# Patient Record
Sex: Female | Born: 1950 | Race: White | Hispanic: No | Marital: Married | Smoking: Never smoker
Health system: Southern US, Community
[De-identification: ages and names within clinical notes are randomized; demographics above are authoritative.]

## PROBLEM LIST (undated history)

## (undated) DIAGNOSIS — Z8249 Family history of ischemic heart disease and other diseases of the circulatory system: Secondary | ICD-10-CM

## (undated) DIAGNOSIS — D649 Anemia, unspecified: Secondary | ICD-10-CM

## (undated) DIAGNOSIS — M1990 Unspecified osteoarthritis, unspecified site: Secondary | ICD-10-CM

## (undated) DIAGNOSIS — I2510 Atherosclerotic heart disease of native coronary artery without angina pectoris: Secondary | ICD-10-CM

## (undated) DIAGNOSIS — E119 Type 2 diabetes mellitus without complications: Secondary | ICD-10-CM

## (undated) DIAGNOSIS — N189 Chronic kidney disease, unspecified: Secondary | ICD-10-CM

## (undated) DIAGNOSIS — K219 Gastro-esophageal reflux disease without esophagitis: Secondary | ICD-10-CM

## (undated) DIAGNOSIS — M2550 Pain in unspecified joint: Secondary | ICD-10-CM

## (undated) DIAGNOSIS — Z9889 Other specified postprocedural states: Secondary | ICD-10-CM

## (undated) DIAGNOSIS — K635 Polyp of colon: Secondary | ICD-10-CM

## (undated) DIAGNOSIS — E669 Obesity, unspecified: Secondary | ICD-10-CM

## (undated) DIAGNOSIS — I219 Acute myocardial infarction, unspecified: Secondary | ICD-10-CM

## (undated) DIAGNOSIS — E785 Hyperlipidemia, unspecified: Secondary | ICD-10-CM

## (undated) DIAGNOSIS — Z9851 Tubal ligation status: Secondary | ICD-10-CM

## (undated) DIAGNOSIS — G4730 Sleep apnea, unspecified: Secondary | ICD-10-CM

## (undated) DIAGNOSIS — I5033 Acute on chronic diastolic (congestive) heart failure: Secondary | ICD-10-CM

## (undated) DIAGNOSIS — I10 Essential (primary) hypertension: Secondary | ICD-10-CM

## (undated) DIAGNOSIS — C439 Malignant melanoma of skin, unspecified: Secondary | ICD-10-CM

## (undated) HISTORY — DX: Polyp of colon: K63.5

## (undated) HISTORY — DX: Anemia, unspecified: D64.9

## (undated) HISTORY — DX: Malignant melanoma of skin, unspecified: C43.9

## (undated) HISTORY — DX: Obesity, unspecified: E66.9

## (undated) HISTORY — DX: Unspecified osteoarthritis, unspecified site: M19.90

## (undated) HISTORY — DX: Tubal ligation status: Z98.51

## (undated) HISTORY — DX: Chronic kidney disease, unspecified: N18.9

## (undated) HISTORY — PX: EYE SURGERY: SHX253

## (undated) HISTORY — DX: Acute myocardial infarction, unspecified: I21.9

## (undated) HISTORY — DX: Hyperlipidemia, unspecified: E78.5

## (undated) HISTORY — DX: Pain in unspecified joint: M25.50

## (undated) HISTORY — DX: Sleep apnea, unspecified: G47.30

## (undated) HISTORY — DX: Atherosclerotic heart disease of native coronary artery without angina pectoris: I25.10

## (undated) HISTORY — PX: CARDIAC CATHETERIZATION: SHX172

## (undated) HISTORY — DX: Other specified postprocedural states: Z98.890

---

## 1972-08-17 HISTORY — PX: TUBAL LIGATION: SHX77

## 1976-08-17 DIAGNOSIS — Z9851 Tubal ligation status: Secondary | ICD-10-CM

## 1976-08-17 HISTORY — DX: Tubal ligation status: Z98.51

## 1998-08-17 DIAGNOSIS — H269 Unspecified cataract: Secondary | ICD-10-CM

## 1998-08-17 HISTORY — DX: Unspecified cataract: H26.9

## 1998-08-23 ENCOUNTER — Encounter: Payer: Self-pay | Admitting: Cardiovascular Disease

## 1998-08-23 ENCOUNTER — Ambulatory Visit (HOSPITAL_COMMUNITY): Admission: RE | Admit: 1998-08-23 | Discharge: 1998-08-23 | Payer: Self-pay | Admitting: Cardiovascular Disease

## 1999-08-20 ENCOUNTER — Ambulatory Visit (HOSPITAL_COMMUNITY): Admission: RE | Admit: 1999-08-20 | Discharge: 1999-08-20 | Payer: Self-pay

## 2002-05-05 ENCOUNTER — Encounter: Payer: Self-pay | Admitting: Pulmonary Disease

## 2002-05-05 ENCOUNTER — Ambulatory Visit (HOSPITAL_COMMUNITY): Admission: RE | Admit: 2002-05-05 | Discharge: 2002-05-05 | Payer: Self-pay | Admitting: Pulmonary Disease

## 2011-04-28 ENCOUNTER — Encounter (INDEPENDENT_AMBULATORY_CARE_PROVIDER_SITE_OTHER): Payer: Self-pay

## 2011-04-29 ENCOUNTER — Ambulatory Visit (INDEPENDENT_AMBULATORY_CARE_PROVIDER_SITE_OTHER): Payer: BC Managed Care – PPO | Admitting: General Surgery

## 2011-04-29 ENCOUNTER — Encounter (INDEPENDENT_AMBULATORY_CARE_PROVIDER_SITE_OTHER): Payer: Self-pay | Admitting: General Surgery

## 2011-04-29 ENCOUNTER — Other Ambulatory Visit (INDEPENDENT_AMBULATORY_CARE_PROVIDER_SITE_OTHER): Payer: Self-pay | Admitting: General Surgery

## 2011-04-29 VITALS — BP 126/64 | HR 72

## 2011-04-29 DIAGNOSIS — R1013 Epigastric pain: Secondary | ICD-10-CM

## 2011-04-29 DIAGNOSIS — R1084 Generalized abdominal pain: Secondary | ICD-10-CM

## 2011-04-29 NOTE — Progress Notes (Signed)
Chief Complaint  Patient presents with  . Other    eval of gallbladder     HPI Courtney Pittman is a 60 y.o. female.   HPI  She returns complaining of a sharp left subscapular pain postprandially. This is more troublesome than when I saw her in May of 2011. Has associated with nausea and vomiting.  No right upper quadrant pain. His epigastric abdominal pain after the nausea and vomiting. Sometimes she gets relief after taking TUMS. No diarrhea or constipation. No fevers or chills.  She has no left shoulder pain with activity.  Past Medical History  Diagnosis Date  . Diabetes mellitus   . Hypertension   . Hyperlipidemia   . Joint pain   . Arthritis   . Vomiting     Past Surgical History  Procedure Date  . Cesarean section 1974  . Tubal ligation 1974  . Eye surgery     cataracts both eyes  . Cardiac catheterization     Family History  Problem Relation Age of Onset  . Hypertension Sister   . Hypertension Brother     Social History History  Substance Use Topics  . Smoking status: Never Smoker   . Smokeless tobacco: Not on file  . Alcohol Use: No    Allergies  Allergen Reactions  . Penicillins     Current Outpatient Prescriptions  Medication Sig Dispense Refill  . aspirin 81 MG tablet Take 81 mg by mouth daily.        Marland Kitchen doxazosin (CARDURA) 4 MG tablet Take 4 mg by mouth at bedtime.        . insulin glargine (LANTUS) 100 UNIT/ML injection Inject into the skin. Injects 19 units in a.m., and 25 units in p.m.      Marland Kitchen insulin lispro (HUMALOG) 100 UNIT/ML injection Inject into the skin. Sliding scale       . metoprolol tartrate (LOPRESSOR) 25 MG tablet Take 25 mg by mouth 2 (two) times daily.        Marland Kitchen omega-3 acid ethyl esters (LOVAZA) 1 G capsule Take 2 g by mouth 2 (two) times daily. Takes one in a.m., one in p.m.       . ONE TOUCH ULTRA TEST test strip       . pramlintide (SYMLIN) 600 MCG/ML injection Inject 60 mcg into the skin 3 (three) times daily before meals.          . ramipril (ALTACE) 10 MG capsule Take 10 mg by mouth.        . simvastatin (ZOCOR) 40 MG tablet Take 40 mg by mouth every morning.        . torsemide (DEMADEX) 20 MG tablet Take 20 mg by mouth every morning.        . Venlafaxine HCl 37.5 MG TB24 Take by mouth.          Review of Systems Review of Systems  Constitutional: Negative for fever, activity change and appetite change.  Respiratory: Positive for shortness of breath.   Cardiovascular: Negative for chest pain.  Gastrointestinal: Positive for abdominal pain. Negative for anal bleeding.       Has not had a colonoscopy.    Blood pressure 126/64, pulse 72.  Physical Exam Physical Exam  Constitutional: No distress.       Overweight.  Eyes: Conjunctivae are normal. Pupils are equal, round, and reactive to light.  Abdominal: Soft. Bowel sounds are normal. She exhibits no distension and no mass. There is no tenderness.  There is no guarding.    Data Reviewed Previous note  Assessment    Worsening left scapular pain postprandially. Associated with nausea and vomiting. No right upper quadrant pain. Abdominal pain occurs after the nausea and vomiting. I still do not believe this is from her gallbladder. This is more likely a gastric etiology in my opinion. Could be related to either a hiatal hernia or gastroparesis.  Of note, she has not had a colonoscopy.    Plan    Will order an upper GI and gastric emptying scan. I think it is very likely she will need a gastroenterology consultation. I will discuss results of these tests when I get them and make the referral as needed.       Johnnetta Holstine J 04/29/2011, 9:45 AM

## 2011-04-29 NOTE — Patient Instructions (Signed)
We will call you with your test dates and then the results when we receive them.

## 2011-05-04 ENCOUNTER — Ambulatory Visit (HOSPITAL_COMMUNITY)
Admission: RE | Admit: 2011-05-04 | Discharge: 2011-05-04 | Disposition: A | Discharge status: Home / Self Care | Payer: BC Managed Care – PPO | Source: Ambulatory Visit | Attending: General Surgery | Admitting: General Surgery

## 2011-05-04 DIAGNOSIS — R1084 Generalized abdominal pain: Secondary | ICD-10-CM

## 2011-05-04 DIAGNOSIS — R188 Other ascites: Secondary | ICD-10-CM | POA: Insufficient documentation

## 2011-05-04 DIAGNOSIS — R109 Unspecified abdominal pain: Secondary | ICD-10-CM | POA: Insufficient documentation

## 2011-05-04 DIAGNOSIS — R112 Nausea with vomiting, unspecified: Secondary | ICD-10-CM | POA: Insufficient documentation

## 2011-05-08 ENCOUNTER — Encounter (HOSPITAL_COMMUNITY)
Admission: RE | Admit: 2011-05-08 | Discharge: 2011-05-08 | Disposition: A | Discharge status: Home / Self Care | Payer: BC Managed Care – PPO | Source: Ambulatory Visit | Attending: General Surgery | Admitting: General Surgery

## 2011-05-08 DIAGNOSIS — R109 Unspecified abdominal pain: Secondary | ICD-10-CM | POA: Insufficient documentation

## 2011-05-08 MED ORDER — TECHNETIUM TC 99M SULFUR COLLOID
2.0000 | Freq: Once | INTRAVENOUS | Status: AC | PRN
  Administered 2011-05-08: 2 via INTRAVENOUS

## 2011-05-11 ENCOUNTER — Other Ambulatory Visit (INDEPENDENT_AMBULATORY_CARE_PROVIDER_SITE_OTHER): Payer: Self-pay | Admitting: General Surgery

## 2011-05-11 ENCOUNTER — Telehealth (INDEPENDENT_AMBULATORY_CARE_PROVIDER_SITE_OTHER): Payer: Self-pay | Admitting: General Surgery

## 2011-05-11 DIAGNOSIS — R1012 Left upper quadrant pain: Secondary | ICD-10-CM

## 2011-05-11 DIAGNOSIS — K228 Other specified diseases of esophagus: Secondary | ICD-10-CM

## 2011-05-11 NOTE — Telephone Encounter (Signed)
I spoke with Courtney Pittman about her studies. Her gastric emptying study demonstrates normal emptying. Her upper GI demonstrates no hiatal hernia. However, presbyesophagus is present. This is consistent with a altered motility and I explained this to her. I feel she needs referral to a gastroenterologist and she is in agreement. We will make the referral. He states that over since her visit with me she's not had any more "spells".

## 2011-05-26 ENCOUNTER — Other Ambulatory Visit (HOSPITAL_COMMUNITY): Payer: Self-pay | Admitting: Gastroenterology

## 2011-06-18 ENCOUNTER — Encounter (HOSPITAL_COMMUNITY)
Admission: RE | Admit: 2011-06-18 | Discharge: 2011-06-18 | Disposition: A | Discharge status: Home / Self Care | Payer: BC Managed Care – PPO | Source: Ambulatory Visit | Attending: Gastroenterology | Admitting: Gastroenterology

## 2011-06-18 DIAGNOSIS — E119 Type 2 diabetes mellitus without complications: Secondary | ICD-10-CM | POA: Insufficient documentation

## 2011-06-18 DIAGNOSIS — Z79899 Other long term (current) drug therapy: Secondary | ICD-10-CM | POA: Insufficient documentation

## 2011-06-18 DIAGNOSIS — I10 Essential (primary) hypertension: Secondary | ICD-10-CM | POA: Insufficient documentation

## 2011-06-18 DIAGNOSIS — R109 Unspecified abdominal pain: Secondary | ICD-10-CM | POA: Insufficient documentation

## 2011-06-18 DIAGNOSIS — E785 Hyperlipidemia, unspecified: Secondary | ICD-10-CM | POA: Insufficient documentation

## 2011-06-25 ENCOUNTER — Telehealth (INDEPENDENT_AMBULATORY_CARE_PROVIDER_SITE_OTHER): Payer: Self-pay

## 2011-06-25 NOTE — Telephone Encounter (Signed)
LMOM notifying pt that her appt has been r/s from 12-3 to 11-14 per Dr Abbey Chatters [facility]

## 2011-07-01 ENCOUNTER — Encounter (INDEPENDENT_AMBULATORY_CARE_PROVIDER_SITE_OTHER): Payer: Self-pay | Admitting: General Surgery

## 2011-07-01 ENCOUNTER — Ambulatory Visit (INDEPENDENT_AMBULATORY_CARE_PROVIDER_SITE_OTHER): Payer: BC Managed Care – PPO | Admitting: General Surgery

## 2011-07-01 VITALS — BP 122/72 | HR 64 | Temp 97.4°F | Resp 18 | Ht 61.0 in | Wt 197.4 lb

## 2011-07-01 DIAGNOSIS — R1013 Epigastric pain: Secondary | ICD-10-CM

## 2011-07-01 NOTE — Progress Notes (Signed)
She is here for followup after her visit with Dr. Loreta Ave. She underwent an abdominal ultrasound and questionable small gallstones were reported. She was placed on the Dexilant and this has helped her symptoms. Her main system his left subscapular pain radiating to the anterior chest with nausea.  Exam: Abdomen-soft, nontender, no masses.  Assessment: Left scapular pain rating to left chest after eating certain types of foods. Has presbyesophagus on upper GI. Passive endoscopy and colonoscopy scheduled for early next month. I do not think his symptoms are caused by gallbladder disease.  Plan: Await results of endoscopies.

## 2011-07-01 NOTE — Patient Instructions (Signed)
Strict lowfat diet. 

## 2011-07-02 ENCOUNTER — Encounter (INDEPENDENT_AMBULATORY_CARE_PROVIDER_SITE_OTHER): Payer: Self-pay

## 2011-07-20 ENCOUNTER — Encounter (INDEPENDENT_AMBULATORY_CARE_PROVIDER_SITE_OTHER): Payer: BC Managed Care – PPO | Admitting: General Surgery

## 2011-08-18 DIAGNOSIS — G4730 Sleep apnea, unspecified: Secondary | ICD-10-CM

## 2011-08-18 HISTORY — DX: Sleep apnea, unspecified: G47.30

## 2011-08-21 ENCOUNTER — Encounter (INDEPENDENT_AMBULATORY_CARE_PROVIDER_SITE_OTHER): Payer: Self-pay | Admitting: Gastroenterology

## 2012-03-17 DIAGNOSIS — Z9889 Other specified postprocedural states: Secondary | ICD-10-CM

## 2012-03-17 HISTORY — DX: Other specified postprocedural states: Z98.890

## 2012-03-19 DIAGNOSIS — R079 Chest pain, unspecified: Secondary | ICD-10-CM

## 2012-03-20 ENCOUNTER — Encounter (HOSPITAL_COMMUNITY): Payer: Self-pay | Admitting: Cardiology

## 2012-03-20 ENCOUNTER — Inpatient Hospital Stay (HOSPITAL_COMMUNITY)
Admission: EM | Admit: 2012-03-20 | Discharge: 2012-03-23 | DRG: 122 | Disposition: A | Discharge status: Home / Self Care | Payer: BC Managed Care – PPO | Source: Other Acute Inpatient Hospital | Attending: Cardiovascular Disease | Admitting: Cardiovascular Disease

## 2012-03-20 DIAGNOSIS — I252 Old myocardial infarction: Secondary | ICD-10-CM | POA: Diagnosis present

## 2012-03-20 DIAGNOSIS — E785 Hyperlipidemia, unspecified: Secondary | ICD-10-CM | POA: Diagnosis present

## 2012-03-20 DIAGNOSIS — E669 Obesity, unspecified: Secondary | ICD-10-CM | POA: Diagnosis present

## 2012-03-20 DIAGNOSIS — I10 Essential (primary) hypertension: Secondary | ICD-10-CM | POA: Diagnosis present

## 2012-03-20 DIAGNOSIS — R079 Chest pain, unspecified: Secondary | ICD-10-CM | POA: Diagnosis present

## 2012-03-20 DIAGNOSIS — N183 Chronic kidney disease, stage 3 unspecified: Secondary | ICD-10-CM | POA: Diagnosis present

## 2012-03-20 DIAGNOSIS — R7989 Other specified abnormal findings of blood chemistry: Secondary | ICD-10-CM

## 2012-03-20 DIAGNOSIS — IMO0001 Reserved for inherently not codable concepts without codable children: Secondary | ICD-10-CM | POA: Diagnosis present

## 2012-03-20 DIAGNOSIS — I214 Non-ST elevation (NSTEMI) myocardial infarction: Secondary | ICD-10-CM

## 2012-03-20 DIAGNOSIS — Z8249 Family history of ischemic heart disease and other diseases of the circulatory system: Secondary | ICD-10-CM

## 2012-03-20 DIAGNOSIS — E119 Type 2 diabetes mellitus without complications: Secondary | ICD-10-CM

## 2012-03-20 DIAGNOSIS — I2510 Atherosclerotic heart disease of native coronary artery without angina pectoris: Secondary | ICD-10-CM | POA: Diagnosis present

## 2012-03-20 DIAGNOSIS — I129 Hypertensive chronic kidney disease with stage 1 through stage 4 chronic kidney disease, or unspecified chronic kidney disease: Secondary | ICD-10-CM | POA: Diagnosis present

## 2012-03-20 DIAGNOSIS — Z79899 Other long term (current) drug therapy: Secondary | ICD-10-CM

## 2012-03-20 DIAGNOSIS — Z794 Long term (current) use of insulin: Secondary | ICD-10-CM

## 2012-03-20 DIAGNOSIS — K219 Gastro-esophageal reflux disease without esophagitis: Secondary | ICD-10-CM | POA: Diagnosis present

## 2012-03-20 HISTORY — DX: Hyperlipidemia, unspecified: E78.5

## 2012-03-20 HISTORY — DX: Gastro-esophageal reflux disease without esophagitis: K21.9

## 2012-03-20 HISTORY — DX: Essential (primary) hypertension: I10

## 2012-03-20 HISTORY — DX: Family history of ischemic heart disease and other diseases of the circulatory system: Z82.49

## 2012-03-20 HISTORY — DX: Type 2 diabetes mellitus without complications: E11.9

## 2012-03-20 LAB — CARDIAC PANEL(CRET KIN+CKTOT+MB+TROPI)
Relative Index: 4.5 — ABNORMAL HIGH (ref 0.0–2.5)
Total CK: 377 U/L — ABNORMAL HIGH (ref 7–177)
Troponin I: 4.76 ng/mL (ref <0.30)

## 2012-03-20 LAB — MAGNESIUM: Magnesium: 2.4 mg/dL (ref 1.5–2.5)

## 2012-03-20 LAB — PRO B NATRIURETIC PEPTIDE: Pro B Natriuretic peptide (BNP): 5133 pg/mL — ABNORMAL HIGH (ref 0–125)

## 2012-03-20 LAB — HEPARIN LEVEL (UNFRACTIONATED): Heparin Unfractionated: 1.57 IU/mL — ABNORMAL HIGH (ref 0.30–0.70)

## 2012-03-20 LAB — PROTIME-INR: Prothrombin Time: 14.9 seconds (ref 11.6–15.2)

## 2012-03-20 LAB — MRSA PCR SCREENING: MRSA by PCR: NEGATIVE

## 2012-03-20 MED ORDER — ASPIRIN EC 81 MG PO TBEC
81.0000 mg | DELAYED_RELEASE_TABLET | Freq: Every day | ORAL | Status: DC
  Administered 2012-03-21 – 2012-03-23 (×3): 81 mg via ORAL
  Filled 2012-03-20 (×3): qty 1

## 2012-03-20 MED ORDER — ASPIRIN 300 MG RE SUPP
300.0000 mg | RECTAL | Status: DC
  Filled 2012-03-20: qty 1

## 2012-03-20 MED ORDER — INSULIN ASPART 100 UNIT/ML ~~LOC~~ SOLN
0.0000 [IU] | SUBCUTANEOUS | Status: DC
Start: 2012-03-21 — End: 2012-03-20

## 2012-03-20 MED ORDER — ASPIRIN 81 MG PO CHEW
324.0000 mg | CHEWABLE_TABLET | ORAL | Status: AC
Start: 2012-03-21 — End: 2012-03-21
  Administered 2012-03-21: 324 mg via ORAL
  Filled 2012-03-20: qty 4

## 2012-03-20 MED ORDER — SODIUM CHLORIDE 0.9 % IJ SOLN: 3.0000 mL | INTRAMUSCULAR | Status: DC | PRN

## 2012-03-20 MED ORDER — ACETAMINOPHEN 325 MG PO TABS: 650.0000 mg | ORAL_TABLET | ORAL | Status: DC | PRN

## 2012-03-20 MED ORDER — RAMIPRIL 10 MG PO CAPS
10.0000 mg | ORAL_CAPSULE | Freq: Every day | ORAL | Status: DC
  Administered 2012-03-22 – 2012-03-23 (×2): 10 mg via ORAL
  Filled 2012-03-20 (×2): qty 1

## 2012-03-20 MED ORDER — SODIUM CHLORIDE 0.9 % IV SOLN: INTRAVENOUS | Status: DC

## 2012-03-20 MED ORDER — HEPARIN (PORCINE) IN NACL 100-0.45 UNIT/ML-% IJ SOLN
900.0000 [IU]/h | INTRAMUSCULAR | Status: DC
  Filled 2012-03-20: qty 250

## 2012-03-20 MED ORDER — ASPIRIN 81 MG PO CHEW: 324.0000 mg | CHEWABLE_TABLET | ORAL | Status: DC

## 2012-03-20 MED ORDER — INSULIN ASPART 100 UNIT/ML ~~LOC~~ SOLN: 0.0000 [IU] | Freq: Three times a day (TID) | SUBCUTANEOUS | Status: DC

## 2012-03-20 MED ORDER — HEPARIN (PORCINE) IN NACL 100-0.45 UNIT/ML-% IJ SOLN
1000.0000 [IU]/h | INTRAMUSCULAR | Status: DC
  Administered 2012-03-20: 1000 [IU]/h via INTRAVENOUS
  Filled 2012-03-20: qty 250

## 2012-03-20 MED ORDER — NITROGLYCERIN 0.4 MG SL SUBL: 0.4000 mg | SUBLINGUAL_TABLET | SUBLINGUAL | Status: DC | PRN

## 2012-03-20 MED ORDER — METOPROLOL TARTRATE 25 MG PO TABS
25.0000 mg | ORAL_TABLET | Freq: Two times a day (BID) | ORAL | Status: DC
  Administered 2012-03-20 – 2012-03-23 (×6): 25 mg via ORAL
  Filled 2012-03-20 (×8): qty 1

## 2012-03-20 MED ORDER — ALPRAZOLAM 0.25 MG PO TABS: 0.2500 mg | ORAL_TABLET | Freq: Two times a day (BID) | ORAL | Status: DC | PRN

## 2012-03-20 MED ORDER — INSULIN GLARGINE 100 UNIT/ML ~~LOC~~ SOLN
15.0000 [IU] | Freq: Every day | SUBCUTANEOUS | Status: DC
  Administered 2012-03-20 – 2012-03-22 (×3): 15 [IU] via SUBCUTANEOUS

## 2012-03-20 MED ORDER — SODIUM CHLORIDE 0.9 % IV SOLN: 250.0000 mL | INTRAVENOUS | Status: DC | PRN

## 2012-03-20 MED ORDER — SIMVASTATIN 20 MG PO TABS
20.0000 mg | ORAL_TABLET | Freq: Every day | ORAL | Status: DC
  Administered 2012-03-21 – 2012-03-22 (×2): 20 mg via ORAL
  Filled 2012-03-20 (×4): qty 1

## 2012-03-20 MED ORDER — DIAZEPAM 5 MG PO TABS
5.0000 mg | ORAL_TABLET | ORAL | Status: AC
  Administered 2012-03-21: 5 mg via ORAL
  Filled 2012-03-20: qty 1

## 2012-03-20 MED ORDER — LEVOFLOXACIN 500 MG PO TABS
500.0000 mg | ORAL_TABLET | Freq: Every day | ORAL | Status: DC
  Administered 2012-03-20 – 2012-03-21 (×2): 500 mg via ORAL
  Filled 2012-03-20 (×3): qty 1

## 2012-03-20 MED ORDER — NITROGLYCERIN 2 % TD OINT
1.0000 [in_us] | TOPICAL_OINTMENT | Freq: Three times a day (TID) | TRANSDERMAL | Status: DC
  Administered 2012-03-20 – 2012-03-21 (×2): 1 [in_us] via TOPICAL
  Filled 2012-03-20: qty 30

## 2012-03-20 MED ORDER — DOXAZOSIN MESYLATE 2 MG PO TABS
2.0000 mg | ORAL_TABLET | Freq: Every day | ORAL | Status: DC
  Administered 2012-03-20 – 2012-03-23 (×4): 2 mg via ORAL
  Filled 2012-03-20 (×4): qty 1

## 2012-03-20 MED ORDER — PANTOPRAZOLE SODIUM 40 MG PO TBEC
40.0000 mg | DELAYED_RELEASE_TABLET | Freq: Two times a day (BID) | ORAL | Status: DC
  Administered 2012-03-21 – 2012-03-23 (×5): 40 mg via ORAL
  Filled 2012-03-20 (×5): qty 1

## 2012-03-20 MED ORDER — INSULIN ASPART 100 UNIT/ML ~~LOC~~ SOLN
0.0000 [IU] | Freq: Three times a day (TID) | SUBCUTANEOUS | Status: DC
  Administered 2012-03-21: 5 [IU] via SUBCUTANEOUS
  Administered 2012-03-21: 3 [IU] via SUBCUTANEOUS
  Administered 2012-03-22: 2 [IU] via SUBCUTANEOUS

## 2012-03-20 MED ORDER — ZOLPIDEM TARTRATE 5 MG PO TABS: 5.0000 mg | ORAL_TABLET | Freq: Every evening | ORAL | Status: DC | PRN

## 2012-03-20 MED ORDER — INSULIN GLARGINE 100 UNIT/ML ~~LOC~~ SOLN
10.0000 [IU] | Freq: Every day | SUBCUTANEOUS | Status: DC
  Administered 2012-03-21 – 2012-03-23 (×3): 10 [IU] via SUBCUTANEOUS

## 2012-03-20 MED ORDER — SODIUM CHLORIDE 0.9 % IJ SOLN
3.0000 mL | Freq: Two times a day (BID) | INTRAMUSCULAR | Status: DC
  Administered 2012-03-20 – 2012-03-21 (×2): 3 mL via INTRAVENOUS

## 2012-03-20 MED ORDER — HYDROCODONE-HOMATROPINE 5-1.5 MG/5ML PO SYRP
5.0000 mL | ORAL_SOLUTION | ORAL | Status: DC | PRN
  Administered 2012-03-20 – 2012-03-23 (×5): 5 mL via ORAL
  Filled 2012-03-20 (×5): qty 5

## 2012-03-20 MED ORDER — SODIUM CHLORIDE 0.9 % IV SOLN: 1.0000 mL/kg/h | INTRAVENOUS | Status: DC

## 2012-03-20 MED ORDER — INSULIN ASPART 100 UNIT/ML ~~LOC~~ SOLN
0.0000 [IU] | Freq: Every day | SUBCUTANEOUS | Status: DC
  Administered 2012-03-21: 4 [IU] via SUBCUTANEOUS
  Administered 2012-03-22: 5 [IU] via SUBCUTANEOUS

## 2012-03-20 MED ORDER — TORSEMIDE 10 MG PO TABS
10.0000 mg | ORAL_TABLET | Freq: Every day | ORAL | Status: DC
  Administered 2012-03-22: 10 mg via ORAL
  Filled 2012-03-20: qty 1

## 2012-03-20 MED ORDER — VENLAFAXINE HCL ER 75 MG PO CP24
75.0000 mg | ORAL_CAPSULE | Freq: Every day | ORAL | Status: DC
  Administered 2012-03-20 – 2012-03-23 (×4): 75 mg via ORAL
  Filled 2012-03-20 (×4): qty 1

## 2012-03-20 MED ORDER — ONDANSETRON HCL 4 MG/2ML IJ SOLN: 4.0000 mg | Freq: Four times a day (QID) | INTRAMUSCULAR | Status: DC | PRN

## 2012-03-20 NOTE — Progress Notes (Signed)
Pt arrived to unit from [facility] .Denied : pain,pressure,tightness,nausea,dizziness,numbness,tingling and SOB. MAEW in bed.Pleasant and conversive.Heparin drip infusing at 1000 units/hr.Pt's spouse and grandaughter arrived to bedside shortly after Pt's arrival .

## 2012-03-20 NOTE — H&P (Signed)
Chantella Creech is an 61 y.o. female.    Cardiologist:  Dr. Allyson Sabal GI: Dr. Loreta Ave  Chief Complaint: back pain with radiation to chest HPI: 61 year old white married female presents to [facility] in [place] Last evening around 10 PM secondary to the back pain that radiated through to her chest she's had this before it occurs after meals.  Last night was different in that the pain would not go away C. Went to the emergency room was given morphine every couple of hours in pain finally resolved this morning at 8 AM. It was associated with nausea but no vomiting no shortness of breath and no diaphoresis.  This is also associated with left shoulder pain. Her pain is resolved but her troponin I has been elevated and she was transferred to [facility] hospital for further evaluation  While in [place] she had positive d-dimer and underwent VQ scan which was negative.  Her amylase and lipase were also negative.  Patient has a history of gallstones she had ultrasound through Dr. Kenna Gilbert office and was told she had small gallstones. She does have similar type of pain though not as severe, that occur frequently after meals.  She also stated the pain was improved with massage.  Cardiac history does include 1 abnormal stress test resulting cardiac catheterization with stable coronary arteries her last cardiac catheterization was 3 years ago at [facility] and she took was told that her coronary arteries were normal.  She does have multiple risk factors including diabetes hypertension dyslipidemia obesity and strong family history of premature coronary disease her father died at 41 with coronary disease.  Currently here at [facility] she is without chest pain.  EKGs at [facility]: #1 sinus rhythm rate of 99 when deeper T wave inversions in aVR and nonspecific ST depression in lead 1 and II.  Q wave in III. On second EKG these are improved.    Past Medical History  Diagnosis Date  . HTN (hypertension)  03/20/2012  . DM (diabetes mellitus), type II insulin dependent 03/20/2012  . Hyperlipidemia 03/20/2012  . GERD (gastroesophageal reflux disease) 03/20/2012  . Family history of premature coronary artery disease 03/20/2012    History reviewed. No pertinent past surgical history.  Family History  Problem Relation Age of Onset  . Coronary artery disease Father    Social History:  reports that she has never smoked. She has never used smokeless tobacco. She reports that she does not drink alcohol or use illicit drugs.Married with one child and one grandchild. Does not exercise.  Allergies:  Allergies  Allergen Reactions  . Penicillins     Outpatient medications: Ramipril 10 mg one daily Simvastatin 5 mg daily Metoprolol unsure of strength Demadex unsure of the strength Lantus insulin 19 units in the morning, 24 units in the evening Humalog sliding scale insulin with meals 3 times a day Cardura 2 mg at bedtime next number Hycodan syrup 5 mL every 4-6 hours.Recently started for bronchitis Levaquin Had been started for her recent bronchitis Received an injection of steroids day prior to admission for bronchitis Over-the-counter vitamin D daily Cinnamon daily Effexor daily Dexilant daily  Laboratory values that [place] pro time 13.6 INR 1.08 PTT 25.8 d-dimer 1.15 glucose 510 this was because she did not receive insulin In the emergency room Creatinine 1.86  Calcium 8.8 total protein 7.6 albumin 4.1 alkaline phosphatase 7 SGOT 25 total bilirubin 0.7 SGPT 23 sodium 132 potassium 4.9 chloride 96 CO2 25 amylase 40 anion gap 15.9  Lipase 19  Hemoglobin 10.4 hematocrit 32.2 CBC 11.8 platelets 39 neutrophils 85.9 lymphs 94 monos 4.6  Initial CK 263 MB 3.9 troponin I 0.01 Second CPK 248 M.B. 10.2 troponin I 0.40 Third CK 286 MB 16.3 troponin I 0.91  VQ scan to rule out pulmonary embolus impression was very low probability for acute pulmonary embolus  Chest x-ray portable no evidence of acute  cardiopulmonary disease mild. Bronchial thickening   ROS: General:Has been coughing recently was diagnosed with bronchitis at urgent care 03/18/2012  Was given an injection of steroids Levaquin and cough syrup Skin:No rashes or ulcers HEENT:No blurred vision or double vision JY:NWGNF pain as described PUL:no shortness of breath GI:No diarrhea constipation or melena history of gastroesophageal reflux disease with indigestion AO:ZHYQ frequent stress incontinence due to coughing no dysuria MS:No joint pain Neuro:No lightheadedness dizziness or syncope Endo:She is diabetic and states it has been controlled thyroid disease   Blood pressure 149/57, pulse 85, temperature 98.5 F (36.9 C), temperature source Oral, resp. rate 13, height 5\' 1"  (1.549 m), weight 92.9 kg (204 lb 12.9 oz), SpO2 100.00%. PE: General:Alert oriented white female, no acute distress pleasant affect Skin:Warm and dry brisk capillary refill HEENT:Normocephalic sclera clear Neck:Supple no JVD no carotid bruit, 2+ carotid upstroke Heart:S1-S2 regular rate and rhythm without obvious murmur gallop rub or click Lungs:Clear without rales rhonchi or wheezes MVH:QIONG soft nontender positive bowel sounds don't palpate liver spleen or masses Ext:No edema positive varicosities especially right ankle area 2+ pedal pulse on the right 1+ pedal on the left 2+ radial pulses bilaterally Neuro:Alert and oriented x3 follows commands moves all extremities    Assessment/Plan Principal Problem:  *Angina at rest Active Problems:  NSTEMI (non-ST elevated myocardial infarction)  DM (diabetes mellitus), type II insulin dependent  HTN (hypertension)  Hyperlipidemia  GERD (gastroesophageal reflux disease)  Family history of premature coronary artery disease  Plan: Patient is currently on IV heparin which we will continue, continue serial cardiac enzymes that was 2 positive enzymes this does appear to be a non-ST elevation MI, though atypical  presentation. Will place on for cardiac catheterization in the morning to further evaluate her coronary status.  Will add nitroglycerin paste tonight as patient is currently not having pain to prevent any recurrence of angina.  We will continue her Lantus insulin but hold her meal coverage and use sliding scale instead.  I'll continue her Levaquin as well as her cough medicine for her bronchitis.  I currently do not have her home dosages of metoprolol we will begin at 25 mg twice a day.  INGOLD,LAURA R 03/20/2012, 6:23 PM  I have reviewed the chart, notes and new data.  I agree with Nada Boozer, NP's note.  Chest pain symptoms are atypical, but ECG changes and mild cardiac enzyme increase are compatible with true ACS. Currently asymptomatic. Plan coronary angiography tomorrow.  Thurmon Fair, MD, [facility] [phone] 03/20/2012, 10:45 PM

## 2012-03-20 NOTE — Progress Notes (Addendum)
ANTICOAGULATION CONSULT NOTE - Initial Consult  Pharmacy Consult for heparin Indication: chest pain/ACS  Allergies not on file  Patient Measurements: Height: 5\' 1"  (154.9 cm) Weight: 204 lb 12.9 oz (92.9 kg) IBW/kg (Calculated) : 47.8  Heparin Dosing Weight: 62.5  Vital Signs: Temp: 98.5 F (36.9 C) (08/04 1630) Temp src: Oral (08/04 1630) BP: 149/57 mmHg (08/04 1730) Pulse Rate: 85  (08/04 1730)  Labs: No results found for this basename: HGB:2,HCT:3,PLT:3,APTT:3,LABPROT:3,INR:3,HEPARINUNFRC:3,CREATININE:3,CKTOTAL:3,CKMB:3,TROPONINI:3 in the last 72 hours  CrCl is unknown because no creatinine reading has been taken.   Medical History: No past medical history on file.  Medications:  No prescriptions prior to admission    Assessment: 61 yo transferred from [facility] to continue heparin for r/o ACS.  Per RN, heparin was started at [facility] with 3000 unit bolus and drip at 1000 units/hr at 14:30.   Goal of Therapy:  Heparin level 0.3-0.7 units/ml Monitor platelets by anticoagulation protocol: Yes   Plan:  Continue heparin at 1000 units/hr. Check heparin level 6 hours after start. Daily heparin level and CBC while on heparin. Monitor for s&s bleeding.  Thanks for allowing pharmacy to be a part of this patient's care.  Talbert Cage, PharmD Clinical Pharmacist, [phone] 03/20/2012,6:09 PM  Addum:  Heparin level 1.57 units/ml.  I feel this level is elevated due to ?lovenox dose given earlier today.  Will hold heparin for 1 hour and resume at 900 units/hr.  Check heparin level with am labs.

## 2012-03-21 ENCOUNTER — Encounter (HOSPITAL_COMMUNITY)
Admission: EM | Disposition: A | Discharge status: Home / Self Care | Payer: Self-pay | Source: Other Acute Inpatient Hospital | Attending: Cardiovascular Disease

## 2012-03-21 HISTORY — PX: LEFT HEART CATHETERIZATION WITH CORONARY ANGIOGRAM: SHX5451

## 2012-03-21 LAB — BASIC METABOLIC PANEL
BUN: 51 mg/dL — ABNORMAL HIGH (ref 6–23)
CO2: 28 mEq/L (ref 19–32)
Chloride: 100 mEq/L (ref 96–112)
Creatinine, Ser: 1.93 mg/dL — ABNORMAL HIGH (ref 0.50–1.10)
GFR calc Af Amer: 32 mL/min — ABNORMAL LOW (ref >90)
Glucose, Bld: 294 mg/dL — ABNORMAL HIGH (ref 70–99)
Potassium: 5.1 mEq/L (ref 3.5–5.1)

## 2012-03-21 LAB — CARDIAC PANEL(CRET KIN+CKTOT+MB+TROPI)
Relative Index: 3.9 — ABNORMAL HIGH (ref 0.0–2.5)
Relative Index: 3.7 — ABNORMAL HIGH (ref 0.0–2.5)
Troponin I: 2.17 ng/mL (ref <0.30)

## 2012-03-21 LAB — CBC
HCT: 28 % — ABNORMAL LOW (ref 36.0–46.0)
Hemoglobin: 9.3 g/dL — ABNORMAL LOW (ref 12.0–15.0)
MCHC: 33.2 g/dL (ref 30.0–36.0)
MCV: 91.5 fL (ref 78.0–100.0)
RDW: 12 % (ref 11.5–15.5)

## 2012-03-21 LAB — HEMOGLOBIN A1C
Hgb A1c MFr Bld: 7 % — ABNORMAL HIGH (ref <5.7)
Mean Plasma Glucose: 154 mg/dL — ABNORMAL HIGH (ref <117)

## 2012-03-21 LAB — LIPID PANEL
LDL Cholesterol: 56 mg/dL (ref 0–99)
Total CHOL/HDL Ratio: 3.6 RATIO

## 2012-03-21 LAB — GLUCOSE, CAPILLARY
Glucose-Capillary: 248 mg/dL — ABNORMAL HIGH (ref 70–99)
Glucose-Capillary: 253 mg/dL — ABNORMAL HIGH (ref 70–99)

## 2012-03-21 LAB — PROTIME-INR: Prothrombin Time: 14.9 seconds (ref 11.6–15.2)

## 2012-03-21 SURGERY — LEFT HEART CATHETERIZATION WITH CORONARY ANGIOGRAM
Anesthesia: LOCAL

## 2012-03-21 MED ORDER — MIDAZOLAM HCL 2 MG/2ML IJ SOLN
INTRAMUSCULAR | Status: AC
  Filled 2012-03-21: qty 2

## 2012-03-21 MED ORDER — FUROSEMIDE 10 MG/ML IJ SOLN
40.0000 mg | Freq: Once | INTRAMUSCULAR | Status: AC
  Administered 2012-03-21: 40 mg via INTRAVENOUS
  Filled 2012-03-21 (×2): qty 4

## 2012-03-21 MED ORDER — HEPARIN (PORCINE) IN NACL 2-0.9 UNIT/ML-% IJ SOLN
INTRAMUSCULAR | Status: AC
  Filled 2012-03-21: qty 2000

## 2012-03-21 MED ORDER — ACETAMINOPHEN 325 MG PO TABS: 650.0000 mg | ORAL_TABLET | ORAL | Status: DC | PRN

## 2012-03-21 MED ORDER — VERAPAMIL HCL 2.5 MG/ML IV SOLN
INTRAVENOUS | Status: AC
  Filled 2012-03-21: qty 2

## 2012-03-21 MED ORDER — SODIUM CHLORIDE 0.9 % IV SOLN: 250.0000 mL | INTRAVENOUS | Status: DC

## 2012-03-21 MED ORDER — HEPARIN (PORCINE) IN NACL 100-0.45 UNIT/ML-% IJ SOLN
800.0000 [IU]/h | INTRAMUSCULAR | Status: DC
  Filled 2012-03-21 (×2): qty 250

## 2012-03-21 MED ORDER — MORPHINE SULFATE 4 MG/ML IJ SOLN: 2.0000 mg | INTRAMUSCULAR | Status: DC | PRN

## 2012-03-21 MED ORDER — SODIUM CHLORIDE 0.9 % IJ SOLN: 3.0000 mL | INTRAMUSCULAR | Status: DC | PRN

## 2012-03-21 MED ORDER — LIDOCAINE HCL (PF) 1 % IJ SOLN
INTRAMUSCULAR | Status: AC
  Filled 2012-03-21: qty 30

## 2012-03-21 MED ORDER — CLOPIDOGREL BISULFATE 75 MG PO TABS
75.0000 mg | ORAL_TABLET | Freq: Every day | ORAL | Status: DC
  Administered 2012-03-22 – 2012-03-23 (×2): 75 mg via ORAL
  Filled 2012-03-21 (×2): qty 1

## 2012-03-21 MED ORDER — CLOPIDOGREL BISULFATE 75 MG PO TABS
300.0000 mg | ORAL_TABLET | Freq: Once | ORAL | Status: AC
  Administered 2012-03-21: 300 mg via ORAL
  Filled 2012-03-21: qty 4

## 2012-03-21 MED ORDER — HEPARIN (PORCINE) IN NACL 100-0.45 UNIT/ML-% IJ SOLN
750.0000 [IU]/h | INTRAMUSCULAR | Status: DC
  Filled 2012-03-21: qty 250

## 2012-03-21 MED ORDER — FENTANYL CITRATE 0.05 MG/ML IJ SOLN
INTRAMUSCULAR | Status: AC
  Filled 2012-03-21: qty 2

## 2012-03-21 MED ORDER — ISOSORBIDE MONONITRATE ER 30 MG PO TB24
30.0000 mg | ORAL_TABLET | Freq: Every day | ORAL | Status: DC
  Administered 2012-03-22 – 2012-03-23 (×2): 30 mg via ORAL
  Filled 2012-03-21 (×4): qty 1

## 2012-03-21 MED ORDER — ONDANSETRON HCL 4 MG/2ML IJ SOLN: 4.0000 mg | Freq: Four times a day (QID) | INTRAMUSCULAR | Status: DC | PRN

## 2012-03-21 MED ORDER — SODIUM CHLORIDE 0.9 % IJ SOLN
3.0000 mL | Freq: Two times a day (BID) | INTRAMUSCULAR | Status: DC
  Administered 2012-03-21 – 2012-03-22 (×2): 3 mL via INTRAVENOUS

## 2012-03-21 MED ORDER — HEPARIN SODIUM (PORCINE) 1000 UNIT/ML IJ SOLN
INTRAMUSCULAR | Status: AC
  Filled 2012-03-21: qty 1

## 2012-03-21 MED ORDER — SODIUM CHLORIDE 0.9 % IV SOLN: 1.0000 mL/kg/h | INTRAVENOUS | Status: AC

## 2012-03-21 MED ORDER — NITROGLYCERIN 0.2 MG/ML ON CALL CATH LAB
INTRAVENOUS | Status: AC
  Filled 2012-03-21: qty 1

## 2012-03-21 MED FILL — Heparin Sodium (Porcine) 100 Unt/ML in Sodium Chloride 0.45%: INTRAMUSCULAR | Qty: 250 | Status: AC

## 2012-03-21 NOTE — CV Procedure (Signed)
[facility] PERCUTANEOUS CORONARY INTERVENTION REPORT  NAME:  Courtney Pittman   MRN: [id number] DOB:  01/05/1951   ADMIT DATE: 03/20/2012  INTERVENTIONAL CARDIOLOGIST: Marykay Lex, M.D., MS PRIMARY CARE PROVIDER: Pcp Not In System PRIMARY CARDIOLOGIST: Runell Gess., MD  PATIENT:  Courtney Pittman is a 61 y.o. female with DM-on insulin HTN, CKD-II-III, obesity (Metabolic Syndrome) with prior cath in 2000 & 2008 demonstrating moderate CAD in RCA & LAD.  She presented with aypical anginal pain on 03/20/12 & ruled in for NSTEMI.  She was referred for invasive cardiac evaluation by cardiac catheterization.   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  IC NTG injection x 3  PROCEDURE: Consent: The procedure with Risks/Benefits/Alternatives and Indications was reviewed with the patient.  All questions were answered.    Risks / Complications include, but not limited to: Death, MI, CVA/TIA, VF/VT (with defibrillation), Bradycardia (need for temporary pacer placement), contrast induced nephropathy, bleeding / bruising / hematoma / pseudoaneurysm, vascular or coronary injury (with possible emergent CT or Vascular Surgery), adverse medication reactions, infection.    The patient voiced understanding and agree to proceed.   Consent for signed by MD and patient with RN witness -- placed on chart.  PROCEDURE: The patient was brought to the 2nd Floor [facility] in the fasting state and prepped and draped in the usual sterile fashion for Right Radial or Groin access.    A modified Allen's test with plethysmography was performed on the right wrist demonstrating adequate Ulnar Artery collateral flow.  Sterile technique was used including antiseptics, cap, gloves, gown, hand hygiene,  mask and sheet.  Skin prep: Chlorhexidine.  Time Out: Verified patient identification, verified procedure, site/side was marked, verified correct patient position, special equipment/implants available, medications/allergies/relevent history reviewed, required imaging and test results available.  Performed  Access: Right Radial Artery; 5 Fr Glide Sheath; Seldinger technique. Diagnostic:  TIG 4.0 Catheter advanced over Versicore wire - RCA Angiography & LV Hemodynamics with pull-back gradient.  Exchanged over [employer] wire for JL 3.5 catheter for LCA Angiography.  IC NTG administered x 1 for RCA & x 2 for LCA.  Catheter & wire removed completely out of the body over the Safety J wire.   There was some difficult with sheath removal due to arterial spasm -- 200 mcg IC NTG, 2.5 mg IV Verapamil + additional 1 mg Versed & 25 mcg Fentanyl IV administered -- sheath successfully removed & TR band applied ensuring adequate bleed-back with non-occlusive hemostatis ensured using reverse modified Allen's test (Barbeau test) with plethysmography.  Hemodynamics:  Central Aortic / Mean Pressures: 106/51 mmHg; 70 mmHg  Left Ventricular Pressures / EDP: 106/21 mmHg; 29 mmHg  Left Ventriculography: Not done in order to conserve contrast as well as due to elevated LV EDP  Coronary Anatomy: Right dominant; diffuse small vessels consistent with long-standing diabetes.  The vessel and gentle diffusely diseased with minimal response/dilation in response to Intracoronary Nitroglycerin injection.  Left Main: Moderate caliber vessel trifurcates into LAD, Ramus Intermedius and Left Circumflex -- there is mild tapering to roughly 10% distally. LAD: Small to moderate caliber vessel the small first diagonal branch followed by a D2 that has ostial/proximal 70-80% stenosis in a roughly 1.5 mm vessel. The remainder the LAD has a roughly  40% lesion at the D2 and is diffusely diseased distally. There is extensive septal  perforators that appear to be collateralizing the RCA although the PDA is patent. The vessel is essentially diffusely diseased with 30-50% lesions the  The main diagonal branch is diffusely diseased after the initial 70-80% lesion.  This vessels not amenable PCI Left Circumflex: Small to moderate caliber vessel the proximal 50% lesion that bifurcates into and OM1 and AV groove circumflex and appears to have another obtuse marginal branch.  The OM1 is a small-caliber vessel then bifurcates distally covering large distribution and has a proximal eccentric lesion and is diffusely diseased distally.  The followup circumflex after the OM1 fills poorly with appear to be subtotally occluded just before giving off another obtuse marginal branch (OM 2) this is filled via Right-To-Left collaterals; not amenable PCI Ramus intermedius: Small caliber vessel covers a large distribution reaching almost the apex. It is diffusely diseased but only to the mild-to-moderate extent.  RCA: Moderate caliber dominant vessel several RV marginal branches.  It does appear to be progression of disease in the proximal and mid vessel that ranging from 40-50% stenosis in what had previously been 30% lesions.  The most distal RV marginal branch appears to be subtotally occluded as his course along the inferior RV wall.    RPDA: Small moderate caliber diffusely diseased proximal 40% lesion and is very diffusely diseased distally 60-70%.  RPL Sysytem:The Right Posterior AV Groove Branch gives rise to a right posterior lateral branch that is small in caliber and diffusely diseased. It gives rise to a lateral to the OM2 and distal left circumflex.  TR Band:  1050 Hours, 10 mL air  ANESTHESIA:   Local Lidocaine 2 ml SEDATION:  Total of 2 mg IV Versed, total of 75 mcg IV fentanyl ; Premedication: 5 mg Valium by mouth MEDICATIONS: Contrast: Omnipaque 80 mL  Anticoagulation: 4500 Units IV Heparin Radial Cocktail: 5 mg Verapamil, 400  mcg NTG, 2 ml 2% Lidocaine in 10 ml NS; plus additional 200 mcg nitroglycerin and 2.5 mg verapamil administered through the sheath prior to sheath removal.  EBL:   < 5 ml  PATIENT DISPOSITION:    The patient was transferred to the PACU holding area in a hemodynamicaly stable, chest pain free condition.  The patient tolerated the procedure well, and there were no complications.  The patient was stable before, during, and after the procedure.  POST-OPERATIVE DIAGNOSIS:    Diffuse moderate coronary disease with severe 2 vessel disease involving the major diagonal branch and the terminal/distal AV groove circumflex with OM 2. Neither vessel is amenable to PCI.  Elevated LVEDP with creatinine of 1.9 therefore LV gram not performed.  Lateral ECG changes, either distribution could be the culprit lesion however neither is PCI amenable.  PLAN OF CARE:  Medical therapy with long-acting oral nitroglycerin plus Plavix based on the PCI CURE Trial.  Consider Ranexa as an outpatient if symptoms are to recur.  Continue statin beta blocker and ACE inhibitor.  Run IV Heparin to complete a total 72 hours - started 6 hours post TR band   Check 2-D echo in the morning to evaluate EF  Gentle hydration based on her renal insufficiency CKD STAGE III in the setting of elevated LVEDP. We'll give IV Lasix tonight and potentially increase torsemide dose tomorrow depending on  Echocardiographic findings.  She is not currently complaining CHF symptoms however her cough could very well be due to pulmonary vascular congestion.  Continue levofloxacin  for now but would consider discontinuing if her symptoms did improve with diuresis.    Anticipate discharge Wednesday, August 7th   Marykay Lex, M.D., M.S. [facility] [address] [place], [place]  [address]  [phone]  03/21/2012 11:00 AM

## 2012-03-21 NOTE — Progress Notes (Signed)
ANTICOAGULATION CONSULT NOTE - follow-up  Pharmacy Consult for heparin Indication: chest pain/ACS  Allergies  Allergen Reactions  . Penicillins     Unknown    Patient Measurements: Height: 5\' 1"  (154.9 cm) Weight: 204 lb 12.9 oz (92.9 kg) IBW/kg (Calculated) : 47.8  Heparin Dosing Weight: 62.5  Vital Signs: Temp: 97.9 F (36.6 C) (08/05 0310) Temp src: Oral (08/05 0310) BP: 115/49 mmHg (08/05 0400) Pulse Rate: 64  (08/05 0400)  Labs:  Basename 03/21/12 0507 03/21/12 0016 03/20/12 2027 03/20/12 1829  HGB 9.3* -- -- --  HCT 28.0* -- -- --  PLT 278 -- -- --  APTT -- -- -- --  LABPROT 14.9 -- 14.9 --  INR 1.15 -- 1.15 --  HEPARINUNFRC 1.14* -- 1.57* --  CREATININE -- -- -- --  CKTOTAL -- 362* -- 377*  CKMB -- 14.0* -- 17.0*  TROPONINI -- 3.76* -- 4.76*    CrCl is unknown because no creatinine reading has been taken.   Medical History: Past Medical History  Diagnosis Date  . HTN (hypertension) 03/20/2012  . DM (diabetes mellitus), type II insulin dependent 03/20/2012  . Hyperlipidemia 03/20/2012  . GERD (gastroesophageal reflux disease) 03/20/2012  . Family history of premature coronary artery disease 03/20/2012    Medications:  Prescriptions prior to admission  Medication Sig Dispense Refill  . aspirin 81 MG chewable tablet Chew 81 mg by mouth daily.      . Cholecalciferol (VITAMIN D PO) Take 1 tablet by mouth daily.      Marland Kitchen Dexlansoprazole 30 MG capsule Take 30 mg by mouth daily.      Marland Kitchen doxazosin (CARDURA) 4 MG tablet Take 4 mg by mouth at bedtime.      . insulin glargine (LANTUS) 100 UNIT/ML injection Inject 19-24 Units into the skin at bedtime. Take 19 units in the morning and 24 at night      . insulin lispro (HUMALOG) 100 UNIT/ML injection Inject 0-8 Units into the skin 3 (three) times daily before meals. Take 1 unit for every 10g of carbohydrates eaten.      . ramipril (ALTACE) 2.5 MG capsule Take 5 mg by mouth daily.      . simvastatin (ZOCOR) 10 MG tablet Take  10 mg by mouth at bedtime.      . torsemide (DEMADEX) 10 MG tablet Take 10 mg by mouth daily.      Marland Kitchen venlafaxine XR (EFFEXOR-XR) 37.5 MG 24 hr capsule Take 75 mg by mouth daily.        Assessment: Heparin level remains elevated this am at 1.14 despite decrease last evening. Lovenox? (see previous note)  Goal of Therapy:  Heparin level 0.3-0.7 units/ml Monitor platelets by anticoagulation protocol: Yes   Plan:  Hold x1 hour restart at 750 units/hr and follow up after 0900 cath.   Janice Coffin

## 2012-03-21 NOTE — Progress Notes (Signed)
Inpatient Diabetes Program Recommendations  AACE/ADA: New Consensus Statement on Inpatient Glycemic Control (2013)  Target Ranges:  Prepandial:   less than 140 mg/dL      Peak postprandial:   less than 180 mg/dL (1-2 hours)      Critically ill patients:  140 - 180 mg/dL    Inpatient Diabetes Program Recommendations Insulin - Meal Coverage: add Novolog 4 units with meals TID  Note: patient takes Humalog 1:10 for carb coverage at home.  Patient is ordered a carb modified medium (60gm) diet.  Thank you  Piedad Climes [facility] Inpatient Diabetes Coordinator [phone]

## 2012-03-21 NOTE — Care Management Note (Signed)
    Page 1 of 1   03/21/2012     9:38:30 AM   CARE MANAGEMENT NOTE 03/21/2012  Patient:  Courtney Pittman, Courtney Pittman   Account Number:  [id number]  Date Initiated:  03/21/2012  Documentation initiated by:  Junius Creamer  Subjective/Objective Assessment:   adm w pos troponins     Action/Plan:   lives w husband   Anticipated DC Date:     Anticipated DC Plan:        DC Planning Services  CM consult      Choice offered to / List presented to:             Status of service:   Medicare Important Message given?   (If response is "NO", the following Medicare IM given date fields will be blank) Date Medicare IM given:   Date Additional Medicare IM given:    Discharge Disposition:    Per UR Regulation:  Reviewed for med. necessity/level of care/duration of stay  If discussed at Long Length of Stay Meetings, dates discussed:    Comments:  8/5 9:36a debbie Kalif Kattner rn,bsn [phone]

## 2012-03-21 NOTE — Progress Notes (Signed)
ANTICOAGULATION CONSULT NOTE - Follow Up  Pharmacy Consult for heparin Indication: chest pain/ACS  Allergies  Allergen Reactions  . Penicillins     Unknown   Patient Measurements: Height: 5\' 1"  (154.9 cm) Weight: 202 lb 9.6 oz (91.9 kg) IBW/kg (Calculated) : 47.8  Heparin Dosing Weight: 62.5  Vital Signs: Temp: 97.8 F (36.6 C) (08/05 1200) Temp src: Oral (08/05 1200) BP: 119/48 mmHg (08/05 1230) Pulse Rate: 57  (08/05 1230)  Labs:  Basename 03/21/12 0507 03/21/12 0016 03/20/12 2027 03/20/12 1829  HGB 9.3* -- -- --  HCT 28.0* -- -- --  PLT 278 -- -- --  APTT -- -- -- --  LABPROT 14.9 -- 14.9 --  INR 1.15 -- 1.15 --  HEPARINUNFRC 1.14* -- 1.57* --  CREATININE 1.93* -- -- --  CKTOTAL 326* 362* -- 377*  CKMB 12.1* 14.0* -- 17.0*  TROPONINI 2.17* 3.76* -- 4.76*   Estimated Creatinine Clearance: 32.4 ml/min (by C-G formula based on Cr of 1.93).  Medical History: Past Medical History  Diagnosis Date  . HTN (hypertension) 03/20/2012  . DM (diabetes mellitus), type II insulin dependent 03/20/2012  . Hyperlipidemia 03/20/2012  . GERD (gastroesophageal reflux disease) 03/20/2012  . Family history of premature coronary artery disease 03/20/2012   Medications:  Prescriptions prior to admission  Medication Sig Dispense Refill  . aspirin 81 MG chewable tablet Chew 81 mg by mouth daily.      . Cholecalciferol (VITAMIN D PO) Take 1 tablet by mouth daily.      Marland Kitchen Dexlansoprazole 30 MG capsule Take 30 mg by mouth daily.      Marland Kitchen doxazosin (CARDURA) 4 MG tablet Take 4 mg by mouth at bedtime.      . insulin glargine (LANTUS) 100 UNIT/ML injection Inject 19-24 Units into the skin at bedtime. Take 19 units in the morning and 24 at night      . insulin lispro (HUMALOG) 100 UNIT/ML injection Inject 0-8 Units into the skin 3 (three) times daily before meals. Take 1 unit for every 10g of carbohydrates eaten.      . ramipril (ALTACE) 2.5 MG capsule Take 5 mg by mouth daily.      . simvastatin  (ZOCOR) 10 MG tablet Take 10 mg by mouth at bedtime.      . torsemide (DEMADEX) 10 MG tablet Take 10 mg by mouth daily.      Marland Kitchen venlafaxine XR (EFFEXOR-XR) 37.5 MG 24 hr capsule Take 75 mg by mouth daily.       Assessment: 61 yo transferred from [facility] to continue heparin for r/o ACS.  Heparin to resume 6 hours after removal of TR band which is now (2PM).  Patient is without noted bleeding complications.  Goal of Therapy:  Heparin level 0.3-0.7 units/ml Monitor platelets by anticoagulation protocol: Yes   Plan:  Will restart IV heparin at 800 units/hr Check heparin level 8 hours after start. Daily heparin level and CBC while on heparin. Monitor for s&s bleeding.  Thanks for allowing pharmacy to be a part of this patient's care.  Nadara Mustard, PharmD., MS Clinical Pharmacist Pager:  [phone] 03/21/2012,1:45 PM

## 2012-03-21 NOTE — Progress Notes (Signed)
[facility] DAILY PROGRESS NOTE  NAME:  Courtney Pittman   MRN: [id number] DOB:  06-11-1951   ADMIT DATE: 03/20/2012   Patient Description   61 y.o. female with PMH below: presented with atypical angina that occurs with rest -- back pain radiating through the chest (often occuring with meals) -- that did not spontaneously subside.  She has ruled in for NSTEMI with positive cardiac biomarkers.  She was ruled out for PE with a negative VQ scan.  Initiated on IV Heparin & NTG paste.   Lantus + SSI for DM & low dose BB + ACE-I.   Past Medical History  Diagnosis Date  . HTN (hypertension) 03/20/2012  . DM (diabetes mellitus), type II insulin dependent 03/20/2012  . Hyperlipidemia 03/20/2012  . GERD (gastroesophageal reflux disease) 03/20/2012  . Family history of premature coronary artery disease 03/20/2012  Gallstones. Obesity   LOS: 1 day   Subjective:   Today Courtney Pittman continues to be pain free.  Just has a "nagging" cough from a recent bout of bronchitis.  Objective:  Temp:  [97.9 F (36.6 C)-99.1 F (37.3 C)] 97.9 F (36.6 C) (08/05 0310) Pulse Rate:  [61-85] 66  (08/05 0700) Resp:  [13-22] 15  (08/05 0700) BP: (111-154)/(37-60) 128/47 mmHg (08/05 0700) SpO2:  [99 %-100 %] 100 % (08/05 0700) Weight:  [91.9 kg (202 lb 9.6 oz)-92.9 kg (204 lb 12.9 oz)] 91.9 kg (202 lb 9.6 oz) (08/05 0600) Weight change:  Physical Exam: General appearance: alert, cooperative, appears stated age, no distress, moderately obese and pleasant. Neck: no adenopathy, no carotid bruit, no JVD, supple, symmetrical, trachea midline and thyroid not enlarged, symmetric, no tenderness/mass/nodules Lungs: clear to auscultation bilaterally, normal percussion bilaterally and non-labored. Heart: regular rate and rhythm, S1, S2 normal, no murmur, click, rub or gallop and distant heart sounds due to body habitus Abdomen: soft, non-tender; bowel sounds normal; no masses,  no organomegaly and  moderately obese Extremities: extremities normal, atraumatic, no cyanosis or edema and varicose veins noted Pulses: ~1+ bilateral LE, R radial pulse 1+ with ~normal Allen's test. Skin: Skin color, texture, turgor normal. No rashes or lesions Neurologic: Alert and oriented X 3, normal strength and tone. Normal symmetric reflexes. Normal coordination and gait Cranial nerves: normal  Intake/Output from previous day: 08/04 0701 - 08/05 0700 In: 1126.1 [I.V.:1126.1] Out: -   Intake/Output Summary (Last 24 hours) at 03/21/12 4098 Last data filed at 03/21/12 0600  Gross per 24 hour  Intake 1126.13 ml  Output      0 ml  Net 1126.13 ml    Results for orders placed during the hospital encounter of 03/20/12 (from the past 24 hour(s))  MRSA PCR SCREENING     Status: Normal   Collection Time   03/20/12  5:24 PM      Component Value Range   MRSA by PCR NEGATIVE  NEGATIVE  CARDIAC PANEL(CRET KIN+CKTOT+MB+TROPI)     Status: Abnormal   Collection Time   03/20/12  6:29 PM      Component Value Range   Total CK 377 (*) 7 - 177 U/L   CK, MB 17.0 (*) 0.3 - 4.0 ng/mL   Troponin I 4.76 (*) <0.30 ng/mL   Relative Index 4.5 (*) 0.0 - 2.5  PRO B NATRIURETIC PEPTIDE     Status: Abnormal   Collection Time   03/20/12  6:29 PM      Component Value Range   Pro B Natriuretic peptide (BNP) 5133.0 (*)  0 - 125 pg/mL  MAGNESIUM     Status: Normal   Collection Time   03/20/12  8:27 PM      Component Value Range   Magnesium 2.4  1.5 - 2.5 mg/dL  HEPARIN LEVEL (UNFRACTIONATED)     Status: Abnormal   Collection Time   03/20/12  8:27 PM      Component Value Range   Heparin Unfractionated 1.57 (*) 0.30 - 0.70 IU/mL  PROTIME-INR     Status: Normal   Collection Time   03/20/12  8:27 PM      Component Value Range   Prothrombin Time 14.9  11.6 - 15.2 seconds   INR 1.15  0.00 - 1.49  GLUCOSE, CAPILLARY     Status: Abnormal   Collection Time   03/20/12 10:45 PM      Component Value Range   Glucose-Capillary 195 (*) 70 -  99 mg/dL  CARDIAC PANEL(CRET KIN+CKTOT+MB+TROPI)     Status: Abnormal   Collection Time   03/21/12 12:16 AM      Component Value Range   Total CK 362 (*) 7 - 177 U/L   CK, MB 14.0 (*) 0.3 - 4.0 ng/mL   Troponin I 3.76 (*) <0.30 ng/mL   Relative Index 3.9 (*) 0.0 - 2.5  CARDIAC PANEL(CRET KIN+CKTOT+MB+TROPI)     Status: Abnormal   Collection Time   03/21/12  5:07 AM      Component Value Range   Total CK 326 (*) 7 - 177 U/L   CK, MB 12.1 (*) 0.3 - 4.0 ng/mL   Troponin I 2.17 (*) <0.30 ng/mL   Relative Index 3.7 (*) 0.0 - 2.5  LIPID PANEL     Status: Abnormal   Collection Time   03/21/12  5:07 AM      Component Value Range   Cholesterol 105  0 - 200 mg/dL   Triglycerides 161  <096 mg/dL   HDL 29 (*) >04 mg/dL   Total CHOL/HDL Ratio 3.6     VLDL 20  0 - 40 mg/dL   LDL Cholesterol 56  0 - 99 mg/dL  BASIC METABOLIC PANEL     Status: Abnormal   Collection Time   03/21/12  5:07 AM      Component Value Range   Sodium 135  135 - 145 mEq/L   Potassium 5.1  3.5 - 5.1 mEq/L   Chloride 100  96 - 112 mEq/L   CO2 28  19 - 32 mEq/L   Glucose, Bld 294 (*) 70 - 99 mg/dL   BUN 51 (*) 6 - 23 mg/dL   Creatinine, Ser 5.40 (*) 0.50 - 1.10 mg/dL   Calcium 8.5  8.4 - 98.1 mg/dL   GFR calc non Af Amer 27 (*) >90 mL/min   GFR calc Af Amer 32 (*) >90 mL/min  CBC     Status: Abnormal   Collection Time   03/21/12  5:07 AM      Component Value Range   WBC 11.7 (*) 4.0 - 10.5 K/uL   RBC 3.06 (*) 3.87 - 5.11 MIL/uL   Hemoglobin 9.3 (*) 12.0 - 15.0 g/dL   HCT 19.1 (*) 47.8 - 29.5 %   MCV 91.5  78.0 - 100.0 fL   MCH 30.4  26.0 - 34.0 pg   MCHC 33.2  30.0 - 36.0 g/dL   RDW 62.1  30.8 - 65.7 %   Platelets 278  150 - 400 K/uL  HEPARIN LEVEL (UNFRACTIONATED)  Status: Abnormal   Collection Time   03/21/12  5:07 AM      Component Value Range   Heparin Unfractionated 1.14 (*) 0.30 - 0.70 IU/mL  PROTIME-INR     Status: Normal   Collection Time   03/21/12  5:07 AM      Component Value Range   Prothrombin Time  14.9  11.6 - 15.2 seconds   INR 1.15  0.00 - 1.49  GLUCOSE, CAPILLARY     Status: Abnormal   Collection Time   03/21/12  7:42 AM      Component Value Range   Glucose-Capillary 248 (*) 70 - 99 mg/dL    Imaging: ECG = NSR 68, Anterolateral TWI (biphasic in V4, TWI in V5)  MAR Reviewed  Assessment/Plan:  Principal Problem:  *NSTEMI (non-ST elevated myocardial infarction) Active Problems:  Angina at rest  Hyperlipidemia  DM (diabetes mellitus), type II insulin dependent  HTN (hypertension)  GERD (gastroesophageal reflux disease)  Family history of premature coronary artery disease Obesity -- ?? Metabolic Syndrome.   Plan LHC +/- PCI today given NSTEMI & significant Cardiac RFs.; troponin trending down.  Will obtain most recent cath report from Office records.  Will attempt R radial approach.  BP has been relatively well controlled on BB & ACE-I  Low dose torsemide as BNP was ~5000 on admission; suspect a component of diastolic HF from ischemia; will see what LV Gram looks like.  CBGs are running a bit high, will need to monitor & consider adjusting Lantus dose.  CARDIAC CATHETERIZATION CONSENT NOTE Performing MD:  Marykay Lex, M.D., M.S.  Procedure:  Heart Catheterization with Coronary Angiograpy  The procedure with Risks/Benefits/Alternatives and Indications was reviewed with the patient.  All questions were answered.    Risks / Complications include, but not limited to: Death, MI, CVA/TIA, VF/VT (with defibrillation), Bradycardia (need for temporary pacer placement), contrast induced nephropathy, bleeding / bruising / hematoma / pseudoaneurysm, vascular or coronary injury (with possible emergent CT or Vascular Surgery), adverse medication reactions, infection.    The patient voices understanding and agree to proceed.     Time Spent Directly with Patient:  15 minutes  Niasha Devins W, M.D., M.S. [facility] [address] [place], [place]  [address]  [phone]  03/21/2012 8:31 AM

## 2012-03-22 LAB — CBC
HCT: 30 % — ABNORMAL LOW (ref 36.0–46.0)
Hemoglobin: 9.7 g/dL — ABNORMAL LOW (ref 12.0–15.0)
MCH: 29.2 pg (ref 26.0–34.0)
MCHC: 32.3 g/dL (ref 30.0–36.0)
MCV: 90.4 fL (ref 78.0–100.0)
Platelets: 280 10*3/uL (ref 150–400)
RBC: 3.32 MIL/uL — ABNORMAL LOW (ref 3.87–5.11)
RDW: 11.8 % (ref 11.5–15.5)
WBC: 9.4 10*3/uL (ref 4.0–10.5)

## 2012-03-22 LAB — URINALYSIS, ROUTINE W REFLEX MICROSCOPIC
Bilirubin Urine: NEGATIVE
Glucose, UA: >1000 mg/dL — AB
Hgb urine dipstick: NEGATIVE
Ketones, ur: NEGATIVE mg/dL
Leukocytes, UA: NEGATIVE
Nitrite: NEGATIVE
Protein, ur: NEGATIVE mg/dL
Specific Gravity, Urine: 1.013 (ref 1.005–1.030)
Urobilinogen, UA: 0.2 mg/dL (ref 0.0–1.0)
pH: 6 (ref 5.0–8.0)

## 2012-03-22 LAB — GLUCOSE, CAPILLARY
Glucose-Capillary: 175 mg/dL — ABNORMAL HIGH (ref 70–99)
Glucose-Capillary: 404 mg/dL — ABNORMAL HIGH (ref 70–99)
Glucose-Capillary: 418 mg/dL — ABNORMAL HIGH (ref 70–99)
Glucose-Capillary: 259 mg/dL — ABNORMAL HIGH (ref 70–99)

## 2012-03-22 LAB — URINE MICROSCOPIC-ADD ON

## 2012-03-22 MED ORDER — INSULIN ASPART 100 UNIT/ML ~~LOC~~ SOLN
0.0000 [IU] | Freq: Three times a day (TID) | SUBCUTANEOUS | Status: DC
  Administered 2012-03-22 – 2012-03-23 (×2): 9 [IU] via SUBCUTANEOUS

## 2012-03-22 MED ORDER — LEVOFLOXACIN 500 MG PO TABS
500.0000 mg | ORAL_TABLET | ORAL | Status: DC
  Filled 2012-03-22: qty 1

## 2012-03-22 NOTE — Progress Notes (Signed)
Nutrition Brief Note  Patient identified on the Nutrition Risk Report for problems chewing or swallowing foods and/or liquids; she states food sometimes "gets stuck" but this does not affect her PO intake.  Body mass index is 38.28 kg/(m^2). Pt meets criteria for Obesity Class II based on current BMI.   Current diet order is Carbohydrate Modified Medium Calorie, patient is consuming approximately 90-100% of meals at this time. Labs and medications reviewed.   No nutrition interventions warranted at this time. If nutrition issues arise, please consult RD.   Kirkland Hun, RD, LDN Pager #: [phone] After-Hours Pager #: [phone]

## 2012-03-22 NOTE — Progress Notes (Signed)
CARDIAC REHAB PHASE I   PRE:  Rate/Rhythm: 72 SR    BP: sitting 125/54    SaO2: 99 RA  MODE:  Ambulation: 500 ft   POST:  Rate/Rhythm: 90 SR    BP: sitting 198/62     SaO2:   Tolerated well, no CP. BP very high after walk on forearm. Down to 163/23 after 30 min rest (if accurate on forearm with disposable cuff). Asked pt to have RN check BP after walk later today. Ed completed. Stressed importance of exercise and pt very interested in [facility] in either [place] or [place]. Will send referral. Family very supportive.  [phone]  Harriet Masson CES, ACSM

## 2012-03-22 NOTE — Progress Notes (Signed)
ANTICOAGULATION CONSULT NOTE - Follow Up Consult  Pharmacy Consult for heparin Indication: post-cath  Labs:  Basename 03/22/12 0507 03/21/12 0507 03/21/12 0016 03/20/12 2027 03/20/12 1829  HGB 9.7* 9.3* -- -- --  HCT 30.0* 28.0* -- -- --  PLT 280 278 -- -- --  APTT -- -- -- -- --  LABPROT -- 14.9 -- 14.9 --  INR -- 1.15 -- 1.15 --  HEPARINUNFRC 0.39 1.14* -- 1.57* --  CREATININE -- 1.93* -- -- --  CKTOTAL -- 326* 362* -- 377*  CKMB -- 12.1* 14.0* -- 17.0*  TROPONINI -- 2.17* 3.76* -- 4.76*    Assessment/Plan:  61yo female now therapeutic on heparin after resumed post-cath with plan for heparin x72hr.  Will continue gtt at current rate and confirm stable with additional level.  Colleen Can PharmD BCPS 03/22/2012,5:54 AM

## 2012-03-22 NOTE — Plan of Care (Signed)
Problem: Phase II Progression Outcomes Goal: Stress Test if indicated Outcome: Not Applicable Date Met:  03/22/12 Cardiac catheterization done

## 2012-03-22 NOTE — Progress Notes (Addendum)
[facility]  DAILY PROGRESS NOTE  Subjective:  No chest pain overnight. Breathing is better. She continues to have an unproductive cough.  Objective:  Temp:  [97.4 F (36.3 C)-98.6 F (37 C)] 98.3 F (36.8 C) (08/06 0738) Pulse Rate:  [56-81] 68  (08/06 0738) Resp:  [10-18] 17  (08/06 0320) BP: (103-160)/(31-57) 154/57 mmHg (08/06 0738) SpO2:  [93 %-100 %] 97 % (08/06 0738) FiO2 (%):  [2 %] 2 % (08/05 0800) Weight change:   Intake/Output from previous day: 08/05 0701 - 08/06 0700 In: 1818 [P.O.:720; I.V.:1094; IV Piggyback:4] Out: 4825 [Urine:4825]  Intake/Output from this shift:    Medications: Current Facility-Administered Medications  Medication Dose Route Frequency Provider Last Rate Last Dose  . 0.9 %  sodium chloride infusion   Intravenous Continuous Nada Boozer, NP      . 0.9 %  sodium chloride infusion  250 mL Intravenous Continuous Marykay Lex, MD 12 mL/hr at 03/21/12 1915 250 mL at 03/21/12 1915  . 0.9 %  sodium chloride infusion  1 mL/kg/hr Intravenous Continuous Marykay Lex, MD 91.9 mL/hr at 03/21/12 1145 1 mL/kg/hr at 03/21/12 1145  . acetaminophen (TYLENOL) tablet 650 mg  650 mg Oral Q4H PRN Marykay Lex, MD      . ALPRAZolam Prudy Feeler) tablet 0.25 mg  0.25 mg Oral BID PRN Nada Boozer, NP      . aspirin EC tablet 81 mg  81 mg Oral Daily Nada Boozer, NP   81 mg at 03/21/12 0932  . clopidogrel (PLAVIX) tablet 300 mg  300 mg Oral Once Marykay Lex, MD   300 mg at 03/21/12 1353  . clopidogrel (PLAVIX) tablet 75 mg  75 mg Oral Q breakfast Marykay Lex, MD      . diazepam (VALIUM) tablet 5 mg  5 mg Oral On Call Nada Boozer, NP   5 mg at 03/21/12 0931  . doxazosin (CARDURA) tablet 2 mg  2 mg Oral Daily Nada Boozer, NP   2 mg at 03/21/12 0932  . fentaNYL (SUBLIMAZE) 0.05 MG/ML injection           . furosemide (LASIX) injection 40 mg  40 mg Intravenous Once Marykay Lex, MD   40 mg at 03/21/12 1353  . heparin 1000  UNIT/ML injection           . heparin 2-0.9 UNIT/ML-% infusion           . heparin ADULT infusion 100 units/mL (25000 units/250 mL)  800 Units/hr Intravenous Continuous Chinita Greenland, PHARMD 8 mL/hr at 03/21/12 2000 800 Units/hr at 03/21/12 2000  . HYDROcodone-homatropine (HYCODAN) 5-1.5 MG/5ML syrup 5 mL  5 mL Oral Q4H PRN Nada Boozer, NP   5 mL at 03/21/12 2142  . insulin aspart (novoLOG) injection 0-5 Units  0-5 Units Subcutaneous QHS Nada Boozer, NP   4 Units at 03/21/12 2200  . insulin aspart (novoLOG) injection 0-9 Units  0-9 Units Subcutaneous TID AC Mihai Croitoru, MD   5 Units at 03/21/12 1747  . insulin glargine (LANTUS) injection 10 Units  10 Units Subcutaneous Daily Nada Boozer, NP   10 Units at 03/21/12 0932  . insulin glargine (LANTUS) injection 15 Units  15 Units Subcutaneous QHS Nada Boozer, NP   15 Units at 03/21/12 2200  . isosorbide mononitrate (IMDUR) 24 hr tablet 30 mg  30 mg Oral Daily Marykay Lex, MD      . levofloxacin [facility]) tablet 500 mg  500 mg Oral q1800 Nada Boozer, NP   500 mg at 03/21/12 1746  . lidocaine (XYLOCAINE) 1 % injection           . metoprolol tartrate (LOPRESSOR) tablet 25 mg  25 mg Oral BID Nada Boozer, NP   25 mg at 03/21/12 2140  . midazolam (VERSED) 2 MG/2ML injection           . morphine 4 MG/ML injection 2 mg  2 mg Intravenous Q1H PRN Marykay Lex, MD      . nitroGLYCERIN (NITROSTAT) SL tablet 0.4 mg  0.4 mg Sublingual Q5 Min x 3 PRN Nada Boozer, NP      . nitroGLYCERIN (NTG ON-CALL) 0.2 mg/mL injection           . ondansetron (ZOFRAN) injection 4 mg  4 mg Intravenous Q6H PRN Nada Boozer, NP      . pantoprazole (PROTONIX) EC tablet 40 mg  40 mg Oral BID AC Nada Boozer, NP   40 mg at 03/21/12 1746  . ramipril (ALTACE) capsule 10 mg  10 mg Oral Daily Nada Boozer, NP      . simvastatin (ZOCOR) tablet 20 mg  20 mg Oral q1800 Nada Boozer, NP   20 mg at 03/21/12 1746  . sodium chloride 0.9 % injection 3 mL  3 mL Intravenous Q12H  Marykay Lex, MD   3 mL at 03/21/12 2142  . sodium chloride 0.9 % injection 3 mL  3 mL Intravenous PRN Marykay Lex, MD      . torsemide [facility]) tablet 10 mg  10 mg Oral Daily Nada Boozer, NP      . venlafaxine XR (EFFEXOR-XR) 24 hr capsule 75 mg  75 mg Oral Daily Nada Boozer, NP   75 mg at 03/21/12 0932  . verapamil (ISOPTIN) 2.5 MG/ML injection           . verapamil (ISOPTIN) 2.5 MG/ML injection           . zolpidem (AMBIEN) tablet 5 mg  5 mg Oral QHS PRN,MR X 1 Nada Boozer, NP      . DISCONTD: 0.9 %  sodium chloride infusion  250 mL Intravenous PRN Nada Boozer, NP      . DISCONTD: 0.9 %  sodium chloride infusion  1 mL/kg/hr Intravenous Continuous Nada Boozer, NP 92.9 mL/hr at 03/20/12 2028 1 mL/kg/hr at 03/20/12 2028  . DISCONTD: acetaminophen (TYLENOL) tablet 650 mg  650 mg Oral Q4H PRN Nada Boozer, NP      . DISCONTD: heparin ADULT infusion 100 units/mL (25000 units/250 mL)  750 Units/hr Intravenous Continuous Mihai Croitoru, MD 7.5 mL/hr at 03/21/12 0725 750 Units/hr at 03/21/12 0725  . DISCONTD: nitroGLYCERIN (NITROGLYN) 2 % ointment 1 inch  1 inch Topical Q8H Nada Boozer, NP   1 inch at 03/21/12 [phone]  . DISCONTD: ondansetron (ZOFRAN) injection 4 mg  4 mg Intravenous Q6H PRN Marykay Lex, MD      . DISCONTD: sodium chloride 0.9 % injection 3 mL  3 mL Intravenous Q12H Nada Boozer, NP   3 mL at 03/21/12 1000  . DISCONTD: sodium chloride 0.9 % injection 3 mL  3 mL Intravenous PRN Nada Boozer, NP        Physical Exam: General appearance: alert and no distress Neck: no adenopathy, no carotid bruit, no JVD, supple, symmetrical, trachea midline and thyroid not enlarged, symmetric, no tenderness/mass/nodules Lungs: rales base - right Heart: regular rate and rhythm, S1, S2 normal, no murmur,  click, rub or gallop Abdomen: soft, non-tender; bowel sounds normal; no masses,  no organomegaly Extremities: extremities normal, atraumatic, no cyanosis or edema Pulses: 2+ and  symmetric  Lab Results: Results for orders placed during the hospital encounter of 03/20/12 (from the past 48 hour(s))  MRSA PCR SCREENING     Status: Normal   Collection Time   03/20/12  5:24 PM      Component Value Range Comment   MRSA by PCR NEGATIVE  NEGATIVE   CARDIAC PANEL(CRET KIN+CKTOT+MB+TROPI)     Status: Abnormal   Collection Time   03/20/12  6:29 PM      Component Value Range Comment   Total CK 377 (*) 7 - 177 U/L    CK, MB 17.0 (*) 0.3 - 4.0 ng/mL    Troponin I 4.76 (*) <0.30 ng/mL    Relative Index 4.5 (*) 0.0 - 2.5   PRO B NATRIURETIC PEPTIDE     Status: Abnormal   Collection Time   03/20/12  6:29 PM      Component Value Range Comment   Pro B Natriuretic peptide (BNP) 5133.0 (*) 0 - 125 pg/mL   HEMOGLOBIN A1C     Status: Abnormal   Collection Time   03/20/12  8:27 PM      Component Value Range Comment   Hemoglobin A1C 7.0 (*) <5.7 %    Mean Plasma Glucose 154 (*) <117 mg/dL   TSH     Status: Normal   Collection Time   03/20/12  8:27 PM      Component Value Range Comment   TSH 1.355  0.350 - 4.500 uIU/mL   MAGNESIUM     Status: Normal   Collection Time   03/20/12  8:27 PM      Component Value Range Comment   Magnesium 2.4  1.5 - 2.5 mg/dL   HEPARIN LEVEL (UNFRACTIONATED)     Status: Abnormal   Collection Time   03/20/12  8:27 PM      Component Value Range Comment   Heparin Unfractionated 1.57 (*) 0.30 - 0.70 IU/mL   PROTIME-INR     Status: Normal   Collection Time   03/20/12  8:27 PM      Component Value Range Comment   Prothrombin Time 14.9  11.6 - 15.2 seconds    INR 1.15  0.00 - 1.49   GLUCOSE, CAPILLARY     Status: Abnormal   Collection Time   03/20/12 10:45 PM      Component Value Range Comment   Glucose-Capillary 195 (*) 70 - 99 mg/dL   CARDIAC PANEL(CRET KIN+CKTOT+MB+TROPI)     Status: Abnormal   Collection Time   03/21/12 12:16 AM      Component Value Range Comment   Total CK 362 (*) 7 - 177 U/L    CK, MB 14.0 (*) 0.3 - 4.0 ng/mL CRITICAL VALUE NOTED.   VALUE IS CONSISTENT WITH PREVIOUSLY REPORTED AND CALLED VALUE.   Troponin I 3.76 (*) <0.30 ng/mL    Relative Index 3.9 (*) 0.0 - 2.5   CARDIAC PANEL(CRET KIN+CKTOT+MB+TROPI)     Status: Abnormal   Collection Time   03/21/12  5:07 AM      Component Value Range Comment   Total CK 326 (*) 7 - 177 U/L    CK, MB 12.1 (*) 0.3 - 4.0 ng/mL CRITICAL VALUE NOTED.  VALUE IS CONSISTENT WITH PREVIOUSLY REPORTED AND CALLED VALUE.   Troponin I 2.17 (*) <0.30 ng/mL    Relative  Index 3.7 (*) 0.0 - 2.5   LIPID PANEL     Status: Abnormal   Collection Time   03/21/12  5:07 AM      Component Value Range Comment   Cholesterol 105  0 - 200 mg/dL    Triglycerides 161  <096 mg/dL    HDL 29 (*) >04 mg/dL    Total CHOL/HDL Ratio 3.6      VLDL 20  0 - 40 mg/dL    LDL Cholesterol 56  0 - 99 mg/dL   BASIC METABOLIC PANEL     Status: Abnormal   Collection Time   03/21/12  5:07 AM      Component Value Range Comment   Sodium 135  135 - 145 mEq/L    Potassium 5.1  3.5 - 5.1 mEq/L    Chloride 100  96 - 112 mEq/L    CO2 28  19 - 32 mEq/L    Glucose, Bld 294 (*) 70 - 99 mg/dL    BUN 51 (*) 6 - 23 mg/dL    Creatinine, Ser 5.40 (*) 0.50 - 1.10 mg/dL    Calcium 8.5  8.4 - 98.1 mg/dL    GFR calc non Af Amer 27 (*) >90 mL/min    GFR calc Af Amer 32 (*) >90 mL/min   CBC     Status: Abnormal   Collection Time   03/21/12  5:07 AM      Component Value Range Comment   WBC 11.7 (*) 4.0 - 10.5 K/uL    RBC 3.06 (*) 3.87 - 5.11 MIL/uL    Hemoglobin 9.3 (*) 12.0 - 15.0 g/dL    HCT 19.1 (*) 47.8 - 46.0 %    MCV 91.5  78.0 - 100.0 fL    MCH 30.4  26.0 - 34.0 pg    MCHC 33.2  30.0 - 36.0 g/dL    RDW 29.5  62.1 - 30.8 %    Platelets 278  150 - 400 K/uL   HEPARIN LEVEL (UNFRACTIONATED)     Status: Abnormal   Collection Time   03/21/12  5:07 AM      Component Value Range Comment   Heparin Unfractionated 1.14 (*) 0.30 - 0.70 IU/mL   PROTIME-INR     Status: Normal   Collection Time   03/21/12  5:07 AM      Component Value Range  Comment   Prothrombin Time 14.9  11.6 - 15.2 seconds    INR 1.15  0.00 - 1.49   GLUCOSE, CAPILLARY     Status: Abnormal   Collection Time   03/21/12  7:42 AM      Component Value Range Comment   Glucose-Capillary 248 (*) 70 - 99 mg/dL   GLUCOSE, CAPILLARY     Status: Abnormal   Collection Time   03/21/12 11:12 AM      Component Value Range Comment   Glucose-Capillary 120 (*) 70 - 99 mg/dL   GLUCOSE, CAPILLARY     Status: Abnormal   Collection Time   03/21/12 11:56 AM      Component Value Range Comment   Glucose-Capillary 120 (*) 70 - 99 mg/dL   GLUCOSE, CAPILLARY     Status: Abnormal   Collection Time   03/21/12  4:54 PM      Component Value Range Comment   Glucose-Capillary 253 (*) 70 - 99 mg/dL   GLUCOSE, CAPILLARY     Status: Abnormal   Collection Time   03/21/12 10:06 PM  Component Value Range Comment   Glucose-Capillary 326 (*) 70 - 99 mg/dL    Comment 1 Notify RN     CBC     Status: Abnormal   Collection Time   03/22/12  5:07 AM      Component Value Range Comment   WBC 9.4  4.0 - 10.5 K/uL    RBC 3.32 (*) 3.87 - 5.11 MIL/uL    Hemoglobin 9.7 (*) 12.0 - 15.0 g/dL    HCT 16.1 (*) 09.6 - 46.0 %    MCV 90.4  78.0 - 100.0 fL    MCH 29.2  26.0 - 34.0 pg    MCHC 32.3  30.0 - 36.0 g/dL    RDW 04.5  40.9 - 81.1 %    Platelets 280  150 - 400 K/uL   HEPARIN LEVEL (UNFRACTIONATED)     Status: Normal   Collection Time   03/22/12  5:07 AM      Component Value Range Comment   Heparin Unfractionated 0.39  0.30 - 0.70 IU/mL     Imaging: No results found.  Assessment:  1. Principal Problem: 2.  *NSTEMI (non-ST elevated myocardial infarction) 3. Active Problems: 4.  Angina at rest 5.  DM (diabetes mellitus), type II insulin dependent 6.  HTN (hypertension) 7.  Hyperlipidemia 8.  GERD (gastroesophageal reflux disease) 9.  Family history of premature coronary artery disease 10.   Plan:  1. Chest pain free yesterday and today. Still with non-productive cough. Finishing course  of antibiotic. Diuresed about 3L negative yesterday. Written for po torsemide. 2D echo performed, will review today. Heparin has been for 24 hours, continue today and d/c in am tomorrow.  Will work on ambulation. Can transfer to telemetry bed. Anticipate d/c tomorrow. She has an appointment with Dr. Allyson Sabal tomorrow that will need to be moved back 1-2 weeks.  Time Spent Directly with Patient:  15 minutes  Length of Stay:  LOS: 2 days   Chrystie Nose, MD, [facility] Attending Cardiologist [facility]  Jovonni Borquez C 03/22/2012, 7:53 AM

## 2012-03-22 NOTE — Progress Notes (Signed)
Pts cbg [phone], missed lunch dose due to order being d/c'd. [employer] notified, and stated ok to give the 9 units of insulin

## 2012-03-22 NOTE — Progress Notes (Addendum)
ANTICOAGULATION CONSULT NOTE - Follow Up Consult  Pharmacy Consult for heparin Indication: post-cath  Labs:  Basename 03/22/12 1117 03/22/12 0507 03/21/12 0507 03/21/12 0016 03/20/12 2027 03/20/12 1829  HGB -- 9.7* 9.3* -- -- --  HCT -- 30.0* 28.0* -- -- --  PLT -- 280 278 -- -- --  APTT -- -- -- -- -- --  LABPROT -- -- 14.9 -- 14.9 --  INR -- -- 1.15 -- 1.15 --  HEPARINUNFRC 0.37 0.39 1.14* -- -- --  CREATININE -- -- 1.93* -- -- --  CKTOTAL -- -- 326* 362* -- 377*  CKMB -- -- 12.1* 14.0* -- 17.0*  TROPONINI -- -- 2.17* 3.76* -- 4.76*    Assessment/Plan: NSTEMI  Anticoag: Plan heparin x 72hrs post-cath. Heparin level 0.39 remains in goal. CBC stable overnight.  ID:Non-productive cough on Levaquin. WBC 9.4. Scr 1.93 (CrCl 32)  Cards: HTN, HLD, positive FH. 154/57,68 Meds: ASA 81mg , Plavix, Cardura, Imdur, metoprolol, Altace, Zocor  GI: GERD on po PPI  Endo: DM on SSI and Lantus. CBGs 120-348/24h  Neuro: Effexor  Goals: Heparin level 0.3-0.7  Plan: Continue IV heparin 800 units/hr. Next heparin level in am. Adjust Levaquin to q48h for low CrCl  Pasty Spillers PharmD BCPS 03/22/2012,1:14 PM

## 2012-03-23 LAB — BASIC METABOLIC PANEL
BUN: 47 mg/dL — ABNORMAL HIGH (ref 6–23)
Calcium: 8.9 mg/dL (ref 8.4–10.5)
Creatinine, Ser: 1.7 mg/dL — ABNORMAL HIGH (ref 0.50–1.10)
GFR calc Af Amer: 37 mL/min — ABNORMAL LOW (ref >90)
GFR calc non Af Amer: 32 mL/min — ABNORMAL LOW (ref >90)
Glucose, Bld: 397 mg/dL — ABNORMAL HIGH (ref 70–99)

## 2012-03-23 LAB — CBC
HCT: 28.1 % — ABNORMAL LOW (ref 36.0–46.0)
Hemoglobin: 9.5 g/dL — ABNORMAL LOW (ref 12.0–15.0)
MCH: 29.9 pg (ref 26.0–34.0)
MCHC: 33.8 g/dL (ref 30.0–36.0)
MCV: 88.4 fL (ref 78.0–100.0)
Platelets: 295 10*3/uL (ref 150–400)
RBC: 3.18 MIL/uL — ABNORMAL LOW (ref 3.87–5.11)
RDW: 11.9 % (ref 11.5–15.5)
WBC: 10.8 10*3/uL — ABNORMAL HIGH (ref 4.0–10.5)

## 2012-03-23 LAB — HEPARIN LEVEL (UNFRACTIONATED): Heparin Unfractionated: 0.32 IU/mL (ref 0.30–0.70)

## 2012-03-23 MED ORDER — NITROGLYCERIN 0.4 MG SL SUBL: 0.4000 mg | SUBLINGUAL_TABLET | SUBLINGUAL | Status: DC | PRN

## 2012-03-23 MED ORDER — LEVOFLOXACIN 500 MG PO TABS: 500.0000 mg | ORAL_TABLET | ORAL | Status: AC

## 2012-03-23 MED ORDER — GUAIFENESIN-DM 100-10 MG/5ML PO SYRP
15.0000 mL | ORAL_SOLUTION | ORAL | Status: DC | PRN
  Administered 2012-03-23 (×2): 15 mL via ORAL
  Filled 2012-03-23 (×2): qty 15

## 2012-03-23 MED ORDER — CLOPIDOGREL BISULFATE 75 MG PO TABS: 75.0000 mg | ORAL_TABLET | Freq: Every day | ORAL | Status: DC

## 2012-03-23 MED ORDER — METOPROLOL TARTRATE 25 MG PO TABS: 25.0000 mg | ORAL_TABLET | Freq: Two times a day (BID) | ORAL | Status: DC

## 2012-03-23 MED ORDER — LOSARTAN POTASSIUM 50 MG PO TABS
50.0000 mg | ORAL_TABLET | Freq: Every day | ORAL | Status: DC
  Filled 2012-03-23: qty 1

## 2012-03-23 MED ORDER — SIMVASTATIN 20 MG PO TABS: 20.0000 mg | ORAL_TABLET | Freq: Every day | ORAL | Status: DC

## 2012-03-23 MED ORDER — LOSARTAN POTASSIUM 50 MG PO TABS: 50.0000 mg | ORAL_TABLET | Freq: Every day | ORAL | Status: DC

## 2012-03-23 MED ORDER — ISOSORBIDE MONONITRATE ER 30 MG PO TB24: 30.0000 mg | ORAL_TABLET | Freq: Every day | ORAL | Status: DC

## 2012-03-23 NOTE — Progress Notes (Signed)
D/c orders received;IVs removed with gauze on; pt remains in stable condition, pt meds and instructions reviewed and given to pt; pt d/c to home

## 2012-03-23 NOTE — Progress Notes (Signed)
ANTICOAGULATION CONSULT NOTE - Follow Up Consult  Pharmacy Consult for heparin Indication: post-cath  Labs:  Basename 03/23/12 0441 03/22/12 1117 03/22/12 0507 03/21/12 0507 03/21/12 0016 03/20/12 2027 03/20/12 1829  HGB 9.5* -- 9.7* -- -- -- --  HCT 28.1* -- 30.0* 28.0* -- -- --  PLT 295 -- 280 278 -- -- --  APTT -- -- -- -- -- -- --  LABPROT -- -- -- 14.9 -- 14.9 --  INR -- -- -- 1.15 -- 1.15 --  HEPARINUNFRC 0.32 0.37 0.39 -- -- -- --  CREATININE -- -- -- 1.93* -- -- --  CKTOTAL -- -- -- 326* 362* -- 377*  CKMB -- -- -- 12.1* 14.0* -- 17.0*  TROPONINI -- -- -- 2.17* 3.76* -- 4.76*    Assessment/Plan: NSTEMI  Anticoag: Plan heparin x 72hrs post-cath. Heparin level 0.32 remains in goal. CBC stable overnight.  ID:Non-productive cough on Levaquin. WBC 10.8 up. Scr 1.93 (CrCl 32). Dose adjusted for renal function.  Cards: HTN, HLD, positive FH. 147/68,68 Meds: ASA 81mg , Plavix, Cardura, Imdur, metoprolol, Altace, Zocor  GI: GERD on po PPI  Endo: DM on SSI and Lantus. CBGs 175-418/24h  Neuro: Effexor  Goals: Heparin level 0.3-0.7  Plan: Continue IV heparin 800 units/hr. Next heparin level in am. Levaquin adjusted to q48h for low CrCl  Pasty Spillers PharmD BCPS 03/23/2012,8:24 AM

## 2012-03-23 NOTE — Discharge Summary (Signed)
Physician Discharge Summary  Patient ID: Courtney Pittman MRN: [id number] DOB/AGE: 59-Oct-1952 59 y.o.  Admit date: 03/20/2012 Discharge date: 03/23/2012  Admission Diagnoses: NSTEMI  Discharge Diagnoses:  Principal Problem:  *NSTEMI (non-ST elevated myocardial infarction) Active Problems:  Angina at rest  DM (diabetes mellitus), type II insulin dependent  HTN (hypertension)  Hyperlipidemia  GERD (gastroesophageal reflux disease)  Family history of premature coronary artery disease  Chronic kidney disease (CKD), stage III (moderate)  Discharged Condition: stable  Hospital Course:   61 year old white married female presented to [facility] in [place] Last evening around 10 PM secondary to the back pain that radiated through to her chest she's had this before and indicated it occurs after meals. Last night was different in that the pain would not go away.  She went to the emergency room was given morphine every couple of hours in pain finally resolved this morning at 8 AM. It was associated with nausea but no vomiting no shortness of breath and no diaphoresis. This is also associated with left shoulder pain. Her pain is resolved but her troponin I has been elevated and she was transferred to [facility] hospital for further evaluation   While in [place] she had positive d-dimer and underwent VQ scan which was negative. Her amylase and lipase were also negative.  She has a history of gallstones.  She had ultrasound through Dr. Kenna Gilbert office and was told she had small gallstones. She does have similar type of pain though not as severe, that occur frequently after meals. She also stated the pain was improved with massage.  Cardiac history does include one abnormal stress test resulting in cardiac catheterization with stable coronary arteries.  Her last cardiac catheterization was 3 years ago at [facility] and she took was told that her coronary arteries were normal. She does have  multiple risk factors including diabetes, hypertension, dyslipidemia, obesity and strong family history of premature coronary disease her father died at 8 with coronary disease.   She was pain free at [facility].   EKGs at [facility]: normal sinus rhythm, rate of 99 with deeper T wave inversions in aVR and nonspecific ST depression in lead 1 and II. Q wave in III. On second EKG these are improved.  Troponin peaked at 4.76.  She was started on nitro paste, heparin infusion and lopressor 25 mg twice daily.  Zocor was increased.  She was scheduled for left heart cath which revealed diffuse moderate coronary disease with severe 2 vessel disease involving the major diagonal branch and the terminal/distal AV groove circumflex with OM 2. Neither vessel was amenable to PCI.  Elevated LVEDP with creatinine of 1.9 therefore LV gram not performed(See full details below).  Imdur was added.  Ramipril was changed to losartan in an effort to eliminate the dry cough she has been experiencing.   Cardiac rehab was consulted. PO torsemide was resumed after initial post-cath IV Lasix for LVEDP of .  Echo results: EF 60-65% grade one diastolic dysfunction.  She had modest improvement to SCr(1.7 at DC) with hydration.  She was discharged home in stable condition with outpatient follow up arranged with Dr. Allyson Sabal.  She will continue Levaquin for the duration of original prescription.  Consults: Cardiac Rehab  Significant Diagnostic Studies:  Hemodynamics:  Central Aortic / Mean Pressures: 106/51 mmHg; 70 mmHg  Left Ventricular Pressures / EDP: 106/21 mmHg; 29 mmHg Left Ventriculography: Not done in order to conserve contrast as well as due to elevated LV EDP  Coronary Anatomy: Right dominant; diffuse small vessels consistent with long-standing diabetes. The vessel and gentle diffusely diseased with minimal response/dilation in response to Intracoronary Nitroglycerin injection.  Left Main: Moderate caliber vessel trifurcates into  LAD, Ramus Intermedius and Left Circumflex -- there is mild tapering to roughly 10% distally. LAD: Small to moderate caliber vessel the small first diagonal branch followed by a D2 that has ostial/proximal 70-80% stenosis in a roughly 1.5 mm vessel. The remainder the LAD has a roughly 40% lesion at the D2 and is diffusely diseased distally. There is extensive septal perforators that appear to be collateralizing the RCA although the PDA is patent. The vessel is essentially diffusely diseased with 30-50% lesions the  The main diagonal branch is diffusely diseased after the initial 70-80% lesion. This vessels not amenable PCI Left Circumflex: Small to moderate caliber vessel the proximal 50% lesion that bifurcates into and OM1 and AV groove circumflex and appears to have another obtuse marginal branch. The OM1 is a small-caliber vessel then bifurcates distally covering large distribution and has a proximal eccentric lesion and is diffusely diseased distally.  The followup circumflex after the OM1 fills poorly with appear to be subtotally occluded just before giving off another obtuse marginal branch (OM 2) this is filled via Right-To-Left collaterals; not amenable PCI Ramus intermedius: Small caliber vessel covers a large distribution reaching almost the apex. It is diffusely diseased but only to the mild-to-moderate extent.  RCA: Moderate caliber dominant vessel several RV marginal branches. It does appear to be progression of disease in the proximal and mid vessel that ranging from 40-50% stenosis in what had previously been 30% lesions. The most distal RV marginal branch appears to be subtotally occluded as his course along the inferior RV wall.  RPDA: Small moderate caliber diffusely diseased proximal 40% lesion and is very diffusely diseased distally 60-70%.  RPL Sysytem:The Right Posterior AV Groove Branch gives rise to a right posterior lateral branch that is small in caliber and diffusely diseased. It  gives rise to a lateral to the OM2 and distal left circumflex. TR Band: 1050 Hours, 10 mL air  ANESTHESIA: Local Lidocaine 2 ml  SEDATION: Total of 2 mg IV Versed, total of 75 mcg IV fentanyl ; Premedication: 5 mg Valium by mouth  MEDICATIONS: Contrast: Omnipaque 80 mL  Anticoagulation: 4500 Units IV Heparin  Radial Cocktail: 5 mg Verapamil, 400 mcg NTG, 2 ml 2% Lidocaine in 10 ml NS; plus additional 200 mcg nitroglycerin and 2.5 mg verapamil administered through the sheath prior to sheath removal.  EBL: < 5 ml  PATIENT DISPOSITION:  The patient was transferred to the PACU holding area in a hemodynamicaly stable, chest pain free condition.  The patient tolerated the procedure well, and there were no complications.  The patient was stable before, during, and after the procedure. POST-OPERATIVE DIAGNOSIS:  Diffuse moderate coronary disease with severe 2 vessel disease involving the major diagonal branch and the terminal/distal AV groove circumflex with OM 2. Neither vessel is amenable to PCI.  Elevated LVEDP with creatinine of 1.9 therefore LV gram not performed.  Lateral ECG changes, either distribution could be the culprit lesion however neither is PCI amenable. PLAN OF CARE:  Medical therapy with long-acting oral nitroglycerin plus Plavix based on the PCI CURE Trial.  Consider Ranexa as an outpatient if symptoms are to recur.  Continue statin beta blocker and ACE inhibitor.  Run IV Heparin to complete a total 72 hours - started 6 hours post TR band  Check 2-D echo in the morning to evaluate EF  Gentle hydration based on her renal insufficiency CKD STAGE III in the setting of elevated LVEDP. We'll give IV Lasix tonight and potentially increase torsemide dose tomorrow depending on Echocardiographic findings. She is not currently complaining CHF symptoms however her cough could very well be due to pulmonary vascular congestion.  Continue levofloxacin for now but would consider discontinuing if  her symptoms did improve with diuresis.  Anticipate discharge Wednesday, August 7th Marykay Lex, M.D., M.S.  [facility]  2D Echo - Post Cath. LV EF: 60% - 65% Study Conclusions  - Left ventricle: The cavity size was normal. There was moderate concentric hypertrophy. Systolic function was normal. The estimated ejection fraction was in the range of 60% to 65%. Doppler parameters are consistent with abnormal left ventricular relaxation (grade 1 diastolic dysfunction). The E/e' ratio is ~10, suggesting borderline elevated LV filling pressure. - Left atrium: The atrium was mildly dilated.  - Inferior vena cava: The IVC measures <1.2 cm and collapses spontaneously, suggesting volume depletion and an RA pressure of 2 mmHg. -- indicative of aggressive diuresis.   Treatments: Imdur, lopressor. Heparin, torsemide, losartan  Discharge Exam: Blood pressure 148/66, pulse 74, temperature 98.2 F (36.8 C), temperature source Oral, resp. rate 18, height 5\' 1"  (1.549 m), weight 90.946 kg (200 lb 8 oz), SpO2 94.00%. General appearance: alert, cooperative and no distress  Lungs: clear to auscultation bilaterally  Heart: regular rate and rhythm and 1/6 sys MM  Extremities: Trace left LEE  Pulses: 2+ and symmetric  Skin: Warm and dry.  Neurologic: Grossly normal   Disposition: Final discharge disposition not confirmed  Discharge Orders    Future Orders Please Complete By Expires   Amb Referral to Cardiac Rehabilitation      Comments:   Referring to [facility] and [facility]   Diet - low sodium heart healthy      Increase activity slowly        Medication List  As of 03/23/2012  2:16 PM   STOP taking these medications         ramipril 2.5 MG capsule         TAKE these medications         aspirin 81 MG chewable tablet   Chew 81 mg by mouth daily.      clopidogrel 75 MG tablet   Commonly known as: PLAVIX   Take 1 tablet (75 mg total) by mouth daily with  breakfast.      Dexlansoprazole 30 MG capsule   Take 30 mg by mouth daily.      doxazosin 4 MG tablet   Commonly known as: CARDURA   Take 4 mg by mouth at bedtime.      insulin glargine 100 UNIT/ML injection   Commonly known as: LANTUS   Inject 19-24 Units into the skin at bedtime. Take 19 units in the morning and 24 at night      insulin lispro 100 UNIT/ML injection   Commonly known as: HUMALOG   Inject 0-8 Units into the skin 3 (three) times daily before meals. Take 1 unit for every 10g of carbohydrates eaten.      isosorbide mononitrate 30 MG 24 hr tablet   Commonly known as: IMDUR   Take 1 tablet (30 mg total) by mouth daily.      levofloxacin 500 MG tablet   Commonly known as: LEVAQUIN   Take 1 tablet (500 mg total) by  mouth every other day.      losartan 50 MG tablet   Commonly known as: COZAAR   Take 1 tablet (50 mg total) by mouth daily.      metoprolol tartrate 25 MG tablet   Commonly known as: LOPRESSOR   Take 1 tablet (25 mg total) by mouth 2 (two) times daily.      nitroGLYCERIN 0.4 MG SL tablet   Commonly known as: NITROSTAT   Place 1 tablet (0.4 mg total) under the tongue every 5 (five) minutes x 3 doses as needed for chest pain.      simvastatin 20 MG tablet   Commonly known as: ZOCOR   Take 1 tablet (20 mg total) by mouth daily at 6 PM.      torsemide 10 MG tablet   Commonly known as: DEMADEX   Take 10 mg by mouth daily.      venlafaxine XR 37.5 MG 24 hr capsule   Commonly known as: EFFEXOR-XR   Take 75 mg by mouth daily.      VITAMIN D PO   Take 1 tablet by mouth daily.           Follow-up Information    Follow up with Runell Gess, MD. (Our office will call with the appointment date and time.)    Contact information:   [address] [place] [address] [phone]          Signed: Wilburt Finlay 03/23/2012, 2:16 PM  I saw an examined the patient this AM along with Mr. Leron Croak. I agree with summary as  noted above.  BMET reviewed - Cr. Stable. Convert to ARB from ACE-I - can be adjusted by Dr. Joselyn Arrow.  ASA/Plavix + BB & ARB along with Cardura & statin.  Complete 10d course of Levaquin. Heparin d/c'd after completing ~72hr due to no revascularization.  She is stable for discharge this afternoon as planned.  Marykay Lex, M.D., M.S. [facility] [address] [place], [place]  [address]  [phone] Pager # [phone]  03/23/2012 2:55 PM

## 2012-03-23 NOTE — Progress Notes (Signed)
[facility]  Subjective: Only complaint is a dry cough.  Objective: Vital signs in last 24 hours: Temp:  [97.9 F (36.6 C)-98.3 F (36.8 C)] 98.2 F (36.8 C) (08/07 0500) Pulse Rate:  [68-84] 74  (08/07 0915) Resp:  [18-20] 18  (08/07 0500) BP: (120-148)/(66-70) 148/66 mmHg (08/07 0915) SpO2:  [94 %-99 %] 94 % (08/07 0500) Weight:  [90.946 kg (200 lb 8 oz)] 90.946 kg (200 lb 8 oz) (08/07 0500) Last BM Date: 03/22/12  Intake/Output from previous day: 08/06 0701 - 08/07 0700 In: 772 [P.O.:760; I.V.:12] Out: 700 [Urine:700] Intake/Output this shift: Total I/O In: 360 [P.O.:360] Out: -   Medications Current Facility-Administered Medications  Medication Dose Route Frequency Provider Last Rate Last Dose  . 0.9 %  sodium chloride infusion   Intravenous Continuous Nada Boozer, NP      . acetaminophen (TYLENOL) tablet 650 mg  650 mg Oral Q4H PRN Marykay Lex, MD      . ALPRAZolam Prudy Feeler) tablet 0.25 mg  0.25 mg Oral BID PRN Nada Boozer, NP      . aspirin EC tablet 81 mg  81 mg Oral Daily Nada Boozer, NP   81 mg at 03/23/12 0915  . clopidogrel (PLAVIX) tablet 75 mg  75 mg Oral Q breakfast Marykay Lex, MD   75 mg at 03/23/12 0727  . doxazosin (CARDURA) tablet 2 mg  2 mg Oral Daily Nada Boozer, NP   2 mg at 03/23/12 0915  . guaiFENesin-dextromethorphan (ROBITUSSIN DM) 100-10 MG/5ML syrup 15 mL  15 mL Oral Q4H PRN Mihai Croitoru, MD   15 mL at 03/23/12 0917  . HYDROcodone-homatropine (HYCODAN) 5-1.5 MG/5ML syrup 5 mL  5 mL Oral Q4H PRN Nada Boozer, NP   5 mL at 03/23/12 0727  . insulin aspart (novoLOG) injection 0-5 Units  0-5 Units Subcutaneous QHS Nada Boozer, NP   5 Units at 03/22/12 2128  . insulin aspart (novoLOG) injection 0-9 Units  0-9 Units Subcutaneous TID WC Abelino Derrick, PA   9 Units at 03/22/12 1705  . insulin glargine (LANTUS) injection 10 Units  10 Units Subcutaneous Daily Nada Boozer, NP   10 Units at 03/23/12 0920  . insulin  glargine (LANTUS) injection 15 Units  15 Units Subcutaneous QHS Nada Boozer, NP   15 Units at 03/22/12 2129  . isosorbide mononitrate (IMDUR) 24 hr tablet 30 mg  30 mg Oral Daily Marykay Lex, MD   30 mg at 03/23/12 0915  . levofloxacin (LEVAQUIN) tablet 500 mg  500 mg Oral Q48H Crystal Stillinger Robertson, PHARMD      . metoprolol tartrate (LOPRESSOR) tablet 25 mg  25 mg Oral BID Nada Boozer, NP   25 mg at 03/23/12 0915  . morphine 4 MG/ML injection 2 mg  2 mg Intravenous Q1H PRN Marykay Lex, MD      . nitroGLYCERIN (NITROSTAT) SL tablet 0.4 mg  0.4 mg Sublingual Q5 Min x 3 PRN Nada Boozer, NP      . ondansetron [facility]) injection 4 mg  4 mg Intravenous Q6H PRN Nada Boozer, NP      . pantoprazole (PROTONIX) EC tablet 40 mg  40 mg Oral BID AC Nada Boozer, NP   40 mg at 03/23/12 0727  . ramipril (ALTACE) capsule 10 mg  10 mg Oral Daily Nada Boozer, NP   10 mg at 03/23/12 0915  . simvastatin (ZOCOR) tablet 20 mg  20 mg Oral q1800 Nada Boozer, NP  20 mg at 03/22/12 1708  . sodium chloride 0.9 % injection 3 mL  3 mL Intravenous PRN Marykay Lex, MD      . venlafaxine XR [facility]) 24 hr capsule 75 mg  75 mg Oral Daily Nada Boozer, NP   75 mg at 03/23/12 0915  . zolpidem (AMBIEN) tablet 5 mg  5 mg Oral QHS PRN,MR X 1 Nada Boozer, NP      . DISCONTD: 0.9 %  sodium chloride infusion  250 mL Intravenous Continuous Marykay Lex, MD 12 mL/hr at 03/21/12 1915 250 mL at 03/21/12 1915  . DISCONTD: heparin ADULT infusion 100 units/mL (25000 units/250 mL)  800 Units/hr Intravenous Continuous Chinita Greenland, PHARMD 8 mL/hr at 03/21/12 2000 800 Units/hr at 03/21/12 2000  . DISCONTD: insulin aspart (novoLOG) injection 0-9 Units  0-9 Units Subcutaneous TID AC Thurmon Fair, MD   2 Units at 03/22/12 0803  . DISCONTD: levofloxacin (LEVAQUIN) tablet 500 mg  500 mg Oral q1800 Nada Boozer, NP   500 mg at 03/21/12 1746  . DISCONTD: sodium chloride 0.9 % injection 3 mL  3 mL Intravenous Q12H  Marykay Lex, MD   3 mL at 03/22/12 0950    PE: General appearance: alert, cooperative and no distress Lungs: clear to auscultation bilaterally Heart: regular rate and rhythm and 1/6 sys MM Extremities: Trace left LEE Pulses: 2+ and symmetric Skin: Warm and dry.   Neurologic: Grossly normal  Lab Results:   Basename 03/23/12 0441 03/22/12 0507 03/21/12 0507  WBC 10.8* 9.4 11.7*  HGB 9.5* 9.7* 9.3*  HCT 28.1* 30.0* 28.0*  PLT 295 280 278   BMET  Basename 03/21/12 0507  NA 135  K 5.1  CL 100  CO2 28  GLUCOSE 294*  BUN 51*  CREATININE 1.93*  CALCIUM 8.5   PT/INR  Basename 03/21/12 0507 03/20/12 2027  LABPROT 14.9 14.9  INR 1.15 1.15   Lipid Panel     Component Value Date/Time   CHOL 105 03/21/2012 0507   TRIG 102 03/21/2012 0507   HDL 29* 03/21/2012 0507   CHOLHDL 3.6 03/21/2012 0507   VLDL 20 03/21/2012 0507   LDLCALC 56 03/21/2012 0507   Assessment/Plan  Principal Problem:  *NSTEMI (non-ST elevated myocardial infarction) Active Problems:  Angina at rest  DM (diabetes mellitus), type II insulin dependent  HTN (hypertension)  Hyperlipidemia  GERD (gastroesophageal reflux disease)  Family history of premature coronary artery disease  Plan:    -S/P NSTEMI and left heart cath revealing diffuse moderate coronary disease with severe 2 vessel disease involving the major diagonal branch and the terminal/distal AV groove circumflex with OM 2. Neither vessel is amenable to PCI.  Preserved EF of 60-65%, grade one diastolic dysfunction.  -Check BMET this AM.  Change ACE to ARB due to dry cough which may be a side-effect of the ACE.    -DC home today.  F/U with Dr. Reeves Forth in 2 weeks.  ASA/ plavix/ imdur/ lopressor/ zocor/ Cardura.  Finish Levaquin therapy (OP plan was 10 day course beginning 8/3).    LOS: 3 days    HAGER, BRYAN 03/23/2012 11:05 AM  I saw an examined the patient this AM along with Mr. Leron Croak.  I agree with his findings & examination -- we  discussed the plan noted above.      BMET reviewed - Cr. Stable.   Convert to ARB from ACE-I - can be adjusted by Dr. Joselyn Arrow.  ASA/Plavix + BB & ARB along with  Cardura & statin.  Complete 10d course of Levaquin.  Marykay Lex, M.D., M.S. [facility] [address] [place], [place]  [address]  [phone] Pager # [phone]  03/23/2012 2:51 PM

## 2012-03-23 NOTE — Progress Notes (Signed)
03/23/12  To Whom it May Concern:  Re: Work Note    Courtney Pittman was hospitalized from 03/20/12 to 03/23/12.  She should not return to work until cleared by her cardiologist.  Sincerely, Tressia Labrum 2:29 PM

## 2012-03-23 NOTE — Progress Notes (Signed)
CARDIAC REHAB PHASE I   PRE:  Rate/Rhythm: 70 SR    BP: sitting 126/50    SaO2:  940 MODE:  Ambulation: 940 ft   POST:  Rate/Rhythm: 92 SR    BP: sitting 170/60     SaO2: 98 RA  Tolerated well. Tired toward end. BP elevated after the walk. Hopefully pt will go to [facility] in either [place] or [place]. [phone]  Harriet Masson CES, ACSM

## 2012-03-24 ENCOUNTER — Encounter (INDEPENDENT_AMBULATORY_CARE_PROVIDER_SITE_OTHER): Payer: Self-pay | Admitting: General Surgery

## 2012-08-17 DIAGNOSIS — C439 Malignant melanoma of skin, unspecified: Secondary | ICD-10-CM

## 2012-08-17 HISTORY — DX: Malignant melanoma of skin, unspecified: C43.9

## 2012-08-17 HISTORY — PX: MELANOMA EXCISION: SHX5266

## 2013-01-19 ENCOUNTER — Telehealth: Payer: Self-pay | Admitting: Cardiovascular Disease

## 2013-01-19 NOTE — Telephone Encounter (Signed)
Faxed the last office note, ekg, echo, stress test and cath to [facility] at [facility] ST.

## 2013-04-03 ENCOUNTER — Other Ambulatory Visit (HOSPITAL_COMMUNITY): Payer: Self-pay | Admitting: Physician Assistant

## 2013-04-03 NOTE — Telephone Encounter (Signed)
Rx was sent to pharmacy electronically. 

## 2013-11-07 ENCOUNTER — Encounter: Payer: Self-pay | Admitting: Cardiovascular Disease

## 2013-11-07 ENCOUNTER — Ambulatory Visit (INDEPENDENT_AMBULATORY_CARE_PROVIDER_SITE_OTHER): Payer: BC Managed Care – PPO | Admitting: Cardiovascular Disease

## 2013-11-07 VITALS — BP 155/72 | HR 80 | Ht 61.0 in | Wt 203.0 lb

## 2013-11-07 DIAGNOSIS — I214 Non-ST elevation (NSTEMI) myocardial infarction: Secondary | ICD-10-CM

## 2013-11-07 DIAGNOSIS — E785 Hyperlipidemia, unspecified: Secondary | ICD-10-CM

## 2013-11-07 DIAGNOSIS — I10 Essential (primary) hypertension: Secondary | ICD-10-CM

## 2013-11-07 NOTE — Assessment & Plan Note (Signed)
On statin therapy followed by her PCP 

## 2013-11-07 NOTE — Patient Instructions (Signed)
Your physician wants you to follow-up in: 1 year with Dr Berry. You will receive a reminder letter in the mail two months in advance. If you don't receive a letter, please call our office to schedule the follow-up appointment.  

## 2013-11-07 NOTE — Assessment & Plan Note (Signed)
Controlled on current medications 

## 2013-11-07 NOTE — Assessment & Plan Note (Signed)
Patient was admitted with a non-STEMI and underwent cardiac catheterization by Dr. Glenetta Hew August of 2013 revealing diagonal and distal circumflex disease not amenable to PCI. Medical therapy was recommended. She had preserved LV function. She denies chest pain but does get chronic left scapular pain of unclear etiology.

## 2013-11-07 NOTE — Progress Notes (Signed)
11/07/2013 Courtney Pittman   1950-11-24  [id number]  Primary Physician [name], MD Primary Cardiologist: [name] [name]   HPI:  The patient is a 63 year old moderately overweight married Caucasian female mother of 2, grandmother to 1 grandchild who I last saw in the office 6 months ago. She has a history of noncritical CAD by cath in 2000, 2008, and again last year performed radially by Dr. Glenetta Hew March 20, 2012. Her other problems include obstructive sleep apnea intolerant to CPAP, hypertension, hyperlipidemia and insulin-dependent diabetes. She really denies chest pain but has had some back pain which I suspect is noncardiac. Dr. Forde Dandy follows her lipid profile.    Current Outpatient Prescriptions  Medication Sig Dispense Refill  . aspirin 81 MG tablet Take 81 mg by mouth daily.        . Cholecalciferol (VITAMIN D PO) Take 1 tablet by mouth daily.      . Cinnamon 500 MG capsule Take 1,000 mg by mouth daily.      . clopidogrel (PLAVIX) 75 MG tablet TAKE 1 TABLET BY MOUTH EVERY DAY FOR BREAKFAST  30 tablet  7  . doxazosin (CARDURA) 4 MG tablet Take 4 mg by mouth at bedtime.        . insulin glargine (LANTUS) 100 UNIT/ML injection Inject 19-24 Units into the skin at bedtime. Take 19 units in the morning and 20 at night      . insulin NPH Human (HUMULIN N,NOVOLIN N) 100 UNIT/ML injection Inject into the skin. Sliding scale with each meal      . isosorbide mononitrate (IMDUR) 30 MG 24 hr tablet TAKE 1 TABLET BY MOUTH EVERY DAY  30 tablet  7  . losartan (COZAAR) 50 MG tablet TAKE 1 TABLET BY MOUTH EVERY DAY  30 tablet  7  . metoprolol tartrate (LOPRESSOR) 25 MG tablet Take 25 mg by mouth 2 (two) times daily.        . nitroGLYCERIN (NITROSTAT) 0.4 MG SL tablet Place 1 tablet (0.4 mg total) under the tongue every 5 (five) minutes x 3 doses as needed for chest pain.  25 tablet  5  . ONE TOUCH ULTRA TEST test strip       . simvastatin (ZOCOR) 20 MG  tablet TAKE 1 TABLET BY MOUTH EVERY DAY AT 6PM  30 tablet  7  . torsemide (DEMADEX) 10 MG tablet Take 10 mg by mouth daily.      Marland Kitchen venlafaxine XR (EFFEXOR-XR) 37.5 MG 24 hr capsule Take 75 mg by mouth daily.       No current facility-administered medications for this visit.    Allergies  Allergen Reactions  . Penicillins   . Penicillins     Unknown    History   Social History  . Marital Status: Married    Spouse Name: N/A    Number of Children: N/A  . Years of Education: N/A   Occupational History  . Not on file.   Social History Main Topics  . Smoking status: Never Smoker   . Smokeless tobacco: Never Used  . Alcohol Use: No  . Drug Use: No  . Sexual Activity: Yes   Other Topics Concern  . Not on file   Social History Narrative   ** Merged History Encounter **         Review of Systems: General: negative for chills, fever, night sweats or weight changes.  Cardiovascular: negative for chest pain, dyspnea on exertion,  edema, orthopnea, palpitations, paroxysmal nocturnal dyspnea or shortness of breath Dermatological: negative for rash Respiratory: negative for cough or wheezing Urologic: negative for hematuria Abdominal: negative for nausea, vomiting, diarrhea, bright red blood per rectum, melena, or hematemesis Neurologic: negative for visual changes, syncope, or dizziness All other systems reviewed and are otherwise negative except as noted above.    Blood pressure 155/72, pulse 80, height 5\' 1"  (1.549 m), weight 203 lb (92.08 kg).  General appearance: alert and no distress Neck: no adenopathy, no carotid bruit, no JVD, supple, symmetrical, trachea midline and thyroid not enlarged, symmetric, no tenderness/mass/nodules Lungs: clear to auscultation bilaterally Heart: regular rate and rhythm, S1, S2 normal, no murmur, click, rub or gallop Extremities: extremities normal, atraumatic, no cyanosis or edema  EKG normal sinus rhythm at 80 without ST or T wave  changes  ASSESSMENT AND PLAN:   NSTEMI (non-ST elevated myocardial infarction) Patient was admitted with a non-STEMI and underwent cardiac catheterization by Dr. Glenetta Hew August of 2013 revealing diagonal and distal circumflex disease not amenable to PCI. Medical therapy was recommended. She had preserved LV function. She denies chest pain but does get chronic left scapular pain of unclear etiology.  HTN (hypertension) Controlled on current medications  Hyperlipidemia On statin therapy followed by her PCP      [name] [facility], [facility] 11/07/2013 4:31 PM

## 2013-12-11 ENCOUNTER — Other Ambulatory Visit: Payer: Self-pay | Admitting: *Deleted

## 2013-12-11 ENCOUNTER — Other Ambulatory Visit (HOSPITAL_COMMUNITY): Payer: Self-pay | Admitting: Physician Assistant

## 2013-12-11 MED ORDER — SIMVASTATIN 20 MG PO TABS: 20.0000 mg | ORAL_TABLET | Freq: Every day | ORAL | Status: DC

## 2013-12-11 MED ORDER — CLOPIDOGREL BISULFATE 75 MG PO TABS: 75.0000 mg | ORAL_TABLET | Freq: Once | ORAL | Status: DC

## 2013-12-11 MED ORDER — ISOSORBIDE MONONITRATE ER 30 MG PO TB24
30.0000 mg | ORAL_TABLET | Freq: Every day | ORAL | Status: DC
Start: 2013-12-11 — End: 2014-08-23

## 2013-12-11 NOTE — Telephone Encounter (Signed)
3 Rx refills sent to patient pharmacy

## 2013-12-16 ENCOUNTER — Other Ambulatory Visit (HOSPITAL_COMMUNITY): Payer: Self-pay | Admitting: Physician Assistant

## 2013-12-18 ENCOUNTER — Other Ambulatory Visit: Payer: Self-pay | Admitting: *Deleted

## 2013-12-18 MED ORDER — LOSARTAN POTASSIUM 50 MG PO TABS: 50.0000 mg | ORAL_TABLET | Freq: Every day | ORAL | Status: DC

## 2013-12-18 NOTE — Telephone Encounter (Signed)
Rx refill sent to pharmacy. 

## 2014-01-15 ENCOUNTER — Other Ambulatory Visit: Payer: Self-pay | Admitting: Endocrinology

## 2014-01-15 DIAGNOSIS — K828 Other specified diseases of gallbladder: Secondary | ICD-10-CM

## 2014-01-18 ENCOUNTER — Ambulatory Visit
Admission: RE | Admit: 2014-01-18 | Discharge: 2014-01-18 | Disposition: A | Discharge status: Home / Self Care | Payer: BC Managed Care – PPO | Source: Ambulatory Visit | Attending: Endocrinology | Admitting: Endocrinology

## 2014-01-18 ENCOUNTER — Encounter (INDEPENDENT_AMBULATORY_CARE_PROVIDER_SITE_OTHER): Payer: Self-pay

## 2014-01-18 DIAGNOSIS — K828 Other specified diseases of gallbladder: Secondary | ICD-10-CM

## 2014-01-19 ENCOUNTER — Encounter: Payer: Self-pay | Admitting: Gastroenterology

## 2014-03-20 ENCOUNTER — Encounter: Payer: Self-pay | Admitting: Gastroenterology

## 2014-03-20 ENCOUNTER — Ambulatory Visit (INDEPENDENT_AMBULATORY_CARE_PROVIDER_SITE_OTHER): Payer: BC Managed Care – PPO | Admitting: Gastroenterology

## 2014-03-20 VITALS — BP 140/50 | HR 72 | Ht 60.75 in | Wt 197.5 lb

## 2014-03-20 DIAGNOSIS — M25519 Pain in unspecified shoulder: Secondary | ICD-10-CM

## 2014-03-20 DIAGNOSIS — M25512 Pain in left shoulder: Secondary | ICD-10-CM

## 2014-03-20 NOTE — Patient Instructions (Addendum)
We will get records sent from your previous gastroenterologist [name] for review.  This will include any endoscopic (colonoscopy, upper endoscopy 12/12) procedures and any associated pathology reports.   You many need esophageal manometry testing for your shoulder, pain pains pending reivew of Dr. Lorie Apley records, pathology.

## 2014-03-20 NOTE — Progress Notes (Signed)
HPI: This is a   very pleasant 63 year old woman whom I am meeting for the first time today.  She has pain after eating certain foods.  The pain is left shoulder, knots up like a ball.  Massaging helps a bit.  Will chew tums, gas ex.  This past weekend she had the pain, belched several times, moved her bowels and the pain improved.  The pain has been going on for 4 years.  Saw orthopedic MD for the shoulder pain did not feel that her pain was related to orthopedic issues   Cardiologist did not feel that her pain was related to cardiac issues    [place] May 2015 IMPRESSION: 1. Several small gallstones. There is pain over the gallbladder and developing acute cholecystitis is a consideration. This report will be called to the provider. 2. Somewhat echogenic renal parenchyma bilaterally may indicate chronic renal medical disease. Correlate with renal laboratory values. No hydronephrosis. 3. The pancreas and portions of the abdominal aorta are obscured by bowel gas. [place] 2012 ([name]); 1. Question small gallstones. No ultrasound evidence of acute cholecystitis. 2. The pancreas is partially obscured by bowel gas. 3. Slightly echogenic renal parenchyma. Correlate with renal laboratory values. 4. An exophytic hypoechoic structure in the mid right kidney probably represents a cyst of 1.8 cm. GES 04/2011 was normal Colonoscopy December 2012, [name], done for screening, a subcentimeter polyp was found. I do not have the pathology report available at time of this visit Upper endoscopy, December 2012, [name], done for dysphasia nausea GERD. Findings a nodular "lesion" at her GE junction that was 5 mm. Duodenitis. I do not have the biopsies, pathology report at the time of this visit  Weight no change.  Dysphagia intermittently  Review of systems: Pertinent positive and negative review of systems were noted in the above HPI section. Complete review of systems was performed and was otherwise  normal.    Past Medical History  Diagnosis Date  . Joint pain   . Arthritis   . Vomiting   . HTN (hypertension) 03/20/2012  . DM (diabetes mellitus), type II insulin dependent 03/20/2012  . Hyperlipidemia 03/20/2012  . GERD (gastroesophageal reflux disease) 03/20/2012  . Family history of premature coronary artery disease 03/20/2012  . Coronary artery disease   . Anemia   . Melanoma 2014  . Colon polyps   . Obesity   . MI (myocardial infarction) 20113    mild    Past Surgical History  Procedure Laterality Date  . Cesarean section  1974  . Tubal ligation  1974  . Eye surgery      cataracts both eyes  . Cardiac catheterization    . Melanoma excision  2014    x 2 back    Current Outpatient Prescriptions  Medication Sig Dispense Refill  . aspirin 81 MG tablet Take 81 mg by mouth daily.        . Cholecalciferol (VITAMIN D PO) Take 1 tablet by mouth daily.      . Cinnamon 500 MG capsule Take 1,000 mg by mouth daily.      . clopidogrel (PLAVIX) 75 MG tablet Take 1 tablet (75 mg total) by mouth once.  30 tablet  7  . doxazosin (CARDURA) 4 MG tablet Take 4 mg by mouth at bedtime.        . Ferrous Sulfate (IRON) 325 (65 FE) MG TABS Take 1 tablet by mouth daily.      . insulin  glargine (LANTUS) 100 UNIT/ML injection Inject 19-24 Units into the skin at bedtime. Take 19 units in the morning and 20 at night      . insulin NPH Human (HUMULIN N,NOVOLIN N) 100 UNIT/ML injection Inject into the skin. Sliding scale with each meal      . isosorbide mononitrate (IMDUR) 30 MG 24 hr tablet Take 1 tablet (30 mg total) by mouth daily.  30 tablet  7  . losartan (COZAAR) 50 MG tablet Take 1 tablet (50 mg total) by mouth daily.  30 tablet  7  . metoprolol tartrate (LOPRESSOR) 25 MG tablet Take 25 mg by mouth 2 (two) times daily.        Marland Kitchen NITROSTAT 0.4 MG SL tablet TAKE 1 TABLET BY MOUTH UNDER THE TONGUE EVERY 5MINUTES FOR 3 DOSES AS NEEDED FOR CHEST PAIN  25 tablet  1  . ONE TOUCH ULTRA TEST test strip        . simvastatin (ZOCOR) 20 MG tablet Take 1 tablet (20 mg total) by mouth daily at 6 PM.  30 tablet  7  . torsemide (DEMADEX) 10 MG tablet Take 10 mg by mouth daily.      Marland Kitchen venlafaxine XR (EFFEXOR-XR) 37.5 MG 24 hr capsule Take 75 mg by mouth daily.       No current facility-administered medications for this visit.    Allergies as of 03/20/2014 - Review Complete 03/20/2014  Allergen Reaction Noted  . Penicillins  04/28/2011  . Penicillins  03/20/2012    Family History  Problem Relation Age of Onset  . Hypertension Sister   . Hypertension Brother   . Heart attack Father   . Diabetes Other     great aunts and uncles    History   Social History  . Marital Status: Married    Spouse Name: N/A    Number of Children: 1A  . Years of Education: N/A   Occupational History  . admin assist    Social History Main Topics  . Smoking status: Never Smoker   . Smokeless tobacco: Never Used  . Alcohol Use: No  . Drug Use: No  . Sexual Activity: Yes   Other Topics Concern  . Not on file   Social History Narrative   ** Merged History Encounter **           Physical Exam: BP 140/50  Pulse 72  Ht 5' 0.75" (1.543 m)  Wt 197 lb 8 oz (89.585 kg)  BMI 37.63 kg/m2 Constitutional: generally well-appearing Psychiatric: alert and oriented x3 Eyes: extraocular movements intact Mouth: oral pharynx moist, no lesions Neck: supple no lymphadenopathy Cardiovascular: heart regular rate and rhythm Lungs: clear to auscultation bilaterally Abdomen: soft, nontender, nondistended, no obvious ascites, no peritoneal signs, normal bowel sounds Extremities: no lower extremity edema bilaterally Skin: no lesions on visible extremities    Assessment and plan: 63 y.o. female with  intermittent, often postprandial, left subscapular discomfort  This would be an unusual symptom for gallstones, would be an unusual symptom for gastric issues or esophageal issues. I would like to get records of the  pathology reports from her previous gastroenterologist [facility] for review. Dr. man described that she had a lesion at her GE junction that was biopsied, also polyp was removed from her colon. Pending review of the pathology I will decide on further testing. I may recommend that she had esophageal manometry, perhaps this postprandial left sided shoulder pain is motility disturbance of the esophagus. That would also be  an unusual symptom however just doesn't really fit with anything classically.

## 2014-03-23 ENCOUNTER — Telehealth: Payer: Self-pay | Admitting: Gastroenterology

## 2014-03-23 NOTE — Telephone Encounter (Signed)
Left message on machine to call back  

## 2014-03-23 NOTE — Telephone Encounter (Signed)
EGD 07/2011 [name] path report: Benign inflammatory hyperplastic polyp of the GE junction. Colonoscopy 12 2012 [name] pathology report one tubular adenoma without high-grade dysplasia. 2 mild melanosis coli.  Please call her.  Let her know I reviewed the pathology reports from above.  She needs recall colonoscopy 07/2016.  The "lesion" in her esophagus was nothing of concern.  For her intermittent chest, back pains she needs hi res esophageal manometry.  Thanks

## 2014-03-28 NOTE — Telephone Encounter (Signed)
Pt aware and would like to call back to schedule manometry

## 2014-03-28 NOTE — Telephone Encounter (Signed)
Left message on machine to call back  

## 2014-07-26 ENCOUNTER — Encounter (HOSPITAL_COMMUNITY): Payer: Self-pay | Admitting: Cardiology

## 2014-08-19 ENCOUNTER — Other Ambulatory Visit: Payer: Self-pay | Admitting: Cardiovascular Disease

## 2014-08-20 NOTE — Telephone Encounter (Signed)
Rx(s) sent to pharmacy electronically.  

## 2014-08-23 ENCOUNTER — Other Ambulatory Visit: Payer: Self-pay | Admitting: Cardiovascular Disease

## 2014-11-22 ENCOUNTER — Other Ambulatory Visit: Payer: Self-pay | Admitting: Cardiovascular Disease

## 2014-11-22 NOTE — Telephone Encounter (Signed)
Rx(s) sent to pharmacy electronically.  

## 2014-11-24 ENCOUNTER — Other Ambulatory Visit: Payer: Self-pay | Admitting: Cardiovascular Disease

## 2014-11-25 ENCOUNTER — Other Ambulatory Visit: Payer: Self-pay | Admitting: Cardiovascular Disease

## 2015-01-07 ENCOUNTER — Telehealth: Payer: Self-pay | Admitting: Cardiovascular Disease

## 2015-01-07 MED ORDER — CLOPIDOGREL BISULFATE 75 MG PO TABS: 75.0000 mg | ORAL_TABLET | Freq: Every day | ORAL | Status: DC

## 2015-01-07 NOTE — Telephone Encounter (Signed)
°  1. Which medications need to be refilled? Clopedigrel  2. Which pharmacy is medication to be sent to?[facility] in [place]  3. Do they need a 30 day or 90 day supply? 30  4. Would they like a call back once the medication has been sent to the pharmacy? Yes

## 2015-01-07 NOTE — Telephone Encounter (Signed)
E-SENT TO PHARMACY PATIENT AWARE

## 2015-01-22 ENCOUNTER — Other Ambulatory Visit: Payer: Self-pay | Admitting: Cardiovascular Disease

## 2015-01-22 NOTE — Telephone Encounter (Signed)
Rx has been sent to the pharmacy electronically. ° °

## 2015-02-02 ENCOUNTER — Other Ambulatory Visit: Payer: Self-pay | Admitting: Cardiovascular Disease

## 2015-02-04 ENCOUNTER — Other Ambulatory Visit: Payer: Self-pay | Admitting: Cardiovascular Disease

## 2015-02-04 NOTE — Telephone Encounter (Signed)
E sent to pharmacy 

## 2015-02-04 NOTE — Telephone Encounter (Signed)
Rx(s) sent to pharmacy electronically. Ov 6/29

## 2015-02-13 ENCOUNTER — Encounter: Payer: Self-pay | Admitting: Cardiovascular Disease

## 2015-02-13 ENCOUNTER — Ambulatory Visit (INDEPENDENT_AMBULATORY_CARE_PROVIDER_SITE_OTHER): Payer: BLUE CROSS/BLUE SHIELD | Admitting: Cardiovascular Disease

## 2015-02-13 VITALS — BP 158/70 | HR 69 | Ht 61.0 in | Wt 198.0 lb

## 2015-02-13 DIAGNOSIS — I10 Essential (primary) hypertension: Secondary | ICD-10-CM

## 2015-02-13 DIAGNOSIS — G4733 Obstructive sleep apnea (adult) (pediatric): Secondary | ICD-10-CM | POA: Diagnosis not present

## 2015-02-13 DIAGNOSIS — E785 Hyperlipidemia, unspecified: Secondary | ICD-10-CM

## 2015-02-13 NOTE — Patient Instructions (Addendum)
Your physician has recommended that you have a sleep study. This test records several body functions during sleep, including: brain activity, eye movement, oxygen and carbon dioxide blood levels, heart rate and rhythm, breathing rate and rhythm, the flow of air through your mouth and nose, snoring, body muscle movements, and chest and belly movement.  Dr Gwenlyn Found recommends that you schedule a follow-up appointment in 1 year. You will receive a reminder letter in the mail two months in advance. If you don't receive a letter, please call our office to schedule the follow-up appointment.

## 2015-02-13 NOTE — Progress Notes (Signed)
02/13/2015 Courtney Pittman   [birth year]  [id number]  Primary Physician Courtney Stack, MD Primary Cardiologist: Courtney Harp MD Courtney Pittman   HPI:   The patient is a 64 year old moderately overweight married Caucasian female mother of 27, grandmother to 1 grandchild who I last saw in the office 12 months ago. She has a history of noncritical CAD by cath in 2000, 2008, and again last year performed radially by Dr. Glenetta Hew March 20, 2012. Her other problems include obstructive sleep apnea intolerant to CPAP and was on C Pap Remotely which she stopped because of mouth dryness., hypertension, hyperlipidemia and insulin-dependent diabetes. She really denies chest pain but has had some back pain which I suspect is noncardiac. Dr. Forde Dandy follows her lipid profile.   Current Outpatient Prescriptions  Medication Sig Dispense Refill  . allopurinol (ZYLOPRIM) 100 MG tablet Take 100 mg by mouth daily.  5  . aspirin 81 MG tablet Take 81 mg by mouth daily.      . Cholecalciferol (VITAMIN D PO) Take 1 tablet by mouth daily.    . Cinnamon 500 MG capsule Take 1,000 mg by mouth daily.    . clopidogrel (PLAVIX) 75 MG tablet Take 1 tablet (75 mg total) by mouth daily. <MUST KEEP APPOINTMENT 6/29> 30 tablet 0  . doxazosin (CARDURA) 4 MG tablet Take 4 mg by mouth at bedtime.      Marland Kitchen FERREX 150 150 MG capsule Take 150 mg by mouth daily.  5  . insulin glargine (LANTUS) 100 UNIT/ML injection Inject 19-24 Units into the skin at bedtime. Take 19 units in the morning and 20 at night    . isosorbide mononitrate (IMDUR) 30 MG 24 hr tablet Take 1 tablet (30 mg total) by mouth daily. NEEDS APPOINTMENT FOR FUTURE REFILLS 30 tablet 0  . losartan (COZAAR) 50 MG tablet TAKE 1 TABLET BY MOUTH EVERY DAY 30 tablet 0  . metoprolol tartrate (LOPRESSOR) 25 MG tablet Take 25 mg by mouth 2 (two) times daily.      Marland Kitchen NITROSTAT 0.4 MG SL tablet TAKE 1 TABLET BY MOUTH UNDER THE TONGUE EVERY 5MINUTES FOR 3 DOSES  AS NEEDED FOR CHEST PAIN 25 tablet 1  . NOVOLOG FLEXPEN 100 UNIT/ML FlexPen Sliding scales  6  . ONE TOUCH ULTRA TEST test strip     . simvastatin (ZOCOR) 20 MG tablet Take 1 tablet (20 mg total) by mouth daily at 6 PM. NEEDS APPOINTMENT FOR FUTURE REFILLS 30 tablet 0  . torsemide (DEMADEX) 10 MG tablet Take 10 mg by mouth daily.    Marland Kitchen venlafaxine XR (EFFEXOR-XR) 37.5 MG 24 hr capsule Take 75 mg by mouth daily.     No current facility-administered medications for this visit.    Allergies  Allergen Reactions  . Penicillins   . Penicillins     Unknown    History   Social History  . Marital Status: Married    Spouse Name: N/A  . Number of Children: 1A  . Years of Education: N/A   Occupational History  . admin assist    Social History Main Topics  . Smoking status: Never Smoker   . Smokeless tobacco: Never Used  . Alcohol Use: No  . Drug Use: No  . Sexual Activity: Yes   Other Topics Concern  . Not on file   Social History Narrative   ** Merged History Encounter **         Review of Systems: General: negative for  chills, fever, night sweats or weight changes.  Cardiovascular: negative for chest pain, dyspnea on exertion, edema, orthopnea, palpitations, paroxysmal nocturnal dyspnea or shortness of breath Dermatological: negative for rash Respiratory: negative for cough or wheezing Urologic: negative for hematuria Abdominal: negative for nausea, vomiting, diarrhea, bright red blood per rectum, melena, or hematemesis Neurologic: negative for visual changes, syncope, or dizziness All other systems reviewed and are otherwise negative except as noted above.    Blood pressure 158/70, pulse 69, height 5\' 1"  (1.549 m), weight 198 lb (89.812 kg).  General appearance: alert and no distress Neck: no adenopathy, no carotid bruit, no JVD, supple, symmetrical, trachea midline and thyroid not enlarged, symmetric, no tenderness/mass/nodules Lungs: clear to auscultation  bilaterally Heart: regular rate and rhythm, S1, S2 normal, no murmur, click, rub or gallop Extremities: extremities normal, atraumatic, no cyanosis or edema  EKG normal sinus rhythm at 69 without ST or T-wave changes. I personally reviewed this EKG  ASSESSMENT AND PLAN:   Hyperlipidemia History of hyperlipidemia on simvastatin 20 mg a day followed by her PCP  HTN (hypertension) History of hypertension with blood pressure measured today at 158/70. She is on doxazosin, losartan and metoprolol. Continue current meds at current dosing.  Obstructive sleep apnea History of obstructive sleep apnea however she has not used C Pap is a dried out her mouth. She continues to be symptomatic. I'm going to reorder a outpatient sleep study and I will have her see Dr. Ellouise Newer back after that for further evaluation.      Courtney Harp MD FACP,FACC,FAHA, [facility] 02/13/2015 10:23 AM

## 2015-02-13 NOTE — Assessment & Plan Note (Addendum)
History of obstructive sleep apnea however she has not used C Pap is a dried out her mouth. She continues to be symptomatic. I'm going to reorder a outpatient sleep study and I will have her see Dr. Ellouise Newer back after that for further evaluation. Her Epworth sleepiness scale was 4.

## 2015-02-13 NOTE — Assessment & Plan Note (Signed)
History of hyperlipidemia on simvastatin  20 mg a day followed by her PCP 

## 2015-02-13 NOTE — Assessment & Plan Note (Signed)
History of hypertension with blood pressure measured today at 158/70. She is on doxazosin, losartan and metoprolol. Continue current meds at current dosing.

## 2015-02-26 ENCOUNTER — Other Ambulatory Visit: Payer: Self-pay | Admitting: Cardiovascular Disease

## 2015-03-04 ENCOUNTER — Other Ambulatory Visit: Payer: Self-pay | Admitting: Cardiovascular Disease

## 2015-03-04 NOTE — Telephone Encounter (Signed)
Rx(s) sent to pharmacy electronically.  

## 2015-04-08 ENCOUNTER — Other Ambulatory Visit: Payer: Self-pay | Admitting: Cardiovascular Disease

## 2015-04-08 NOTE — Telephone Encounter (Signed)
Rx(s) sent to pharmacy electronically.  

## 2015-04-27 ENCOUNTER — Other Ambulatory Visit: Payer: Self-pay | Admitting: Cardiovascular Disease

## 2015-04-29 NOTE — Telephone Encounter (Signed)
E-sent to pharmacy  Duplicate discontinued losartan x 2

## 2015-05-10 ENCOUNTER — Ambulatory Visit (HOSPITAL_BASED_OUTPATIENT_CLINIC_OR_DEPARTMENT_OTHER): Payer: BLUE CROSS/BLUE SHIELD | Attending: Cardiovascular Disease

## 2015-05-10 VITALS — Ht 62.0 in | Wt 197.0 lb

## 2015-05-10 DIAGNOSIS — G4761 Periodic limb movement disorder: Secondary | ICD-10-CM | POA: Diagnosis not present

## 2015-05-10 DIAGNOSIS — G4752 REM sleep behavior disorder: Secondary | ICD-10-CM | POA: Insufficient documentation

## 2015-05-10 DIAGNOSIS — R0683 Snoring: Secondary | ICD-10-CM | POA: Diagnosis not present

## 2015-05-10 DIAGNOSIS — G4733 Obstructive sleep apnea (adult) (pediatric): Secondary | ICD-10-CM

## 2015-05-27 NOTE — Sleep Study (Signed)
Patient Name: Courtney Pittman, Courtney Pittman Date: 05/10/2015 Gender: Female D.O.B: [birth year] Age (years): 64 Referring Provider: Lorretta Harp Height (inches): 14 Interpreting Physician: Shelva Majestic MD, ABSM Weight (lbs): 197 RPSGT: Jonna Coup BMI: 36 MRN: [id number] Neck Size: 17.00 CLINICAL INFORMATION Sleep Study Type: NPSG  Indication for sleep study: Previous history of mild OSA  Epworth Sleepiness Score: N/A  SLEEP STUDY TECHNIQUE As per the AASM Manual for the Scoring of Sleep and Associated Events v2.3 (April 2016) with a hypopnea requiring 4% desaturations.  The channels recorded and monitored were frontal, central and occipital EEG, electrooculogram (EOG), submentalis EMG (chin), nasal and oral airflow, thoracic and abdominal wall motion, anterior tibialis EMG, snore microphone, electrocardiogram, and pulse oximetry.  MEDICATIONS   allopurinol (ZYLOPRIM) 100 MG tablet 100 mg, Daily     Note: Received from: External Pharmacy (Written 02/13/2015 0958)     aspirin 81 MG tablet 81 mg, Daily     Cholecalciferol (VITAMIN D PO) 1 tablet, Daily     Cinnamon 500 MG capsule 1,000 mg, Daily     clopidogrel (PLAVIX) 75 MG tablet 75 mg, Daily     clopidogrel (PLAVIX) 75 MG tablet      doxazosin (CARDURA) 4 MG tablet 4 mg, Daily at bedtime     FERREX 150 150 MG capsule 150 mg, Daily     Note: Received from: External Pharmacy (Written 02/13/2015 0958)     insulin glargine (LANTUS) 100 UNIT/ML injection 19-24 Units, Daily at bedtime     isosorbide mononitrate (IMDUR) 30 MG 24 hr tablet 30 mg, Daily     losartan (COZAAR) 50 MG tablet      metoprolol tartrate (LOPRESSOR) 25 MG tablet 25 mg, 2 times daily     NITROSTAT 0.4 MG SL tablet      NOVOLOG FLEXPEN 100 UNIT/ML FlexPen      Note: Received from: External Pharmacy Received Sig: INJECT 0-8 UNITS THREE TIMES A DAY BEFORE MEALS, TAKE 1 UNIT FOR EVERY 10 GRAMS OF CARBS (Written 02/13/2015 0958)    ONE TOUCH ULTRA TEST test  strip      simvastatin (ZOCOR) 20 MG tablet 20 mg, Daily-1800     torsemide (DEMADEX) 10 MG tablet 10 mg, Daily     venlafaxine XR (EFFEXOR-XR) 37.5 MG 24 hr capsule   Patient's medications include: N/A. Medications self-administered by patient during sleep study : No sleep medicine administered.  SLEEP ARCHITECTURE The study was initiated at 10:44:15 PM and ended at 4:46:37 AM.  Sleep onset time was 36.0 minutes and the sleep efficiency was 83.9%. The total sleep time was 303.9 minutes. Wake after sleep onset (WASO): 22.5 minutes  Stage REM latency was N/A minutes.  The patient spent 0.82% of the night in stage N1 sleep, 99.18% in stage N2 sleep, 0.00% in stage N3 and 0.00% in REM.  Alpha intrusion was absent.  Supine sleep was 4.79%.  RESPIRATORY PARAMETERS The overall apnea/hypopnea index (AHI) was 0.0 per hour. There were 0 total apneas, including 0 obstructive, 0 central and 0 mixed apneas. There were 0 hypopneas and 0 RERAs.  The AHI during Stage REM sleep was N/A per hour.  AHI while supine was 0.0 per hour.  The mean oxygen saturation was 95.81%. The minimum SpO2 during sleep was 92.00%.  Moderate snoring was noted during this study.  CARDIAC DATA The 2 lead EKG demonstrated sinus rhythm. The mean heart rate was 84.76 beats per minute. Other EKG findings include: None.  LEG MOVEMENT DATA The  total PLMS were 118 with a resulting PLMS index of 23.30. Associated arousal with leg movement index was 2.8 .  IMPRESSIONS No significant obstructive sleep apnea; however, there was absence of  REM sleep and only very minimal supine sleep Reduced sleep efficiency. Abnormal sleep architecture with absence of both slow wave sleep and  REM sleep The patient had minimal or no oxygen desaturation during the study (Min O2 = 92.00%) The patient snored with Moderate snoring volume. No cardiac abnormalities were noted during this study. Mild periodic limb movements of sleep occurred  during the study. No significant associated arousals.  DIAGNOSIS Moderate snoring Periodic limb movement disorder of sleep  RECOMMENDATIONS At present, there is no indication for CPAP therapy; however, the true severeity of sleep disordered breathing may be underestimated on the present study due to the absence of REM sleep and with minimal supine sleep. Consider alternatives for the treatment of snoring If patient is symptomatic with restless legs, consider a trial of pharmacotherapy Avoid alcohol, sedatives and other CNS depressants that may worsen sleep apnea and disrupt normal sleep architecture. Sleep hygiene should be reviewed to assess factors that may improve sleep quality. Weight management and regular exercise should be initiated or continued if appropriate.   Troy Sine, MD, [place], American Board of Sleep Medicine   ELECTRONICALLY SIGNED ON:  05/27/2015, 8:56 AM [place] PH: [phone]   FX: [phone] [place]

## 2015-05-28 ENCOUNTER — Telehealth: Payer: Self-pay | Admitting: *Deleted

## 2015-05-28 NOTE — Telephone Encounter (Signed)
Called and notified patient of sleep study results.

## 2015-07-08 ENCOUNTER — Encounter (HOSPITAL_COMMUNITY): Payer: Self-pay | Admitting: Family Medicine

## 2015-07-08 ENCOUNTER — Emergency Department (HOSPITAL_COMMUNITY): Payer: BLUE CROSS/BLUE SHIELD

## 2015-07-08 ENCOUNTER — Inpatient Hospital Stay (HOSPITAL_COMMUNITY)
Admission: EM | Admit: 2015-07-08 | Discharge: 2015-07-10 | DRG: 291 | Disposition: A | Discharge status: Home / Self Care | Payer: BLUE CROSS/BLUE SHIELD | Attending: Family Medicine | Admitting: Family Medicine

## 2015-07-08 DIAGNOSIS — E1122 Type 2 diabetes mellitus with diabetic chronic kidney disease: Secondary | ICD-10-CM | POA: Diagnosis present

## 2015-07-08 DIAGNOSIS — Z9842 Cataract extraction status, left eye: Secondary | ICD-10-CM | POA: Diagnosis not present

## 2015-07-08 DIAGNOSIS — I130 Hypertensive heart and chronic kidney disease with heart failure and stage 1 through stage 4 chronic kidney disease, or unspecified chronic kidney disease: Principal | ICD-10-CM | POA: Diagnosis present

## 2015-07-08 DIAGNOSIS — I509 Heart failure, unspecified: Secondary | ICD-10-CM | POA: Diagnosis not present

## 2015-07-08 DIAGNOSIS — Z85820 Personal history of malignant melanoma of skin: Secondary | ICD-10-CM | POA: Diagnosis not present

## 2015-07-08 DIAGNOSIS — Z9841 Cataract extraction status, right eye: Secondary | ICD-10-CM

## 2015-07-08 DIAGNOSIS — R0602 Shortness of breath: Secondary | ICD-10-CM | POA: Diagnosis present

## 2015-07-08 DIAGNOSIS — G4733 Obstructive sleep apnea (adult) (pediatric): Secondary | ICD-10-CM | POA: Diagnosis present

## 2015-07-08 DIAGNOSIS — Z7982 Long term (current) use of aspirin: Secondary | ICD-10-CM | POA: Diagnosis not present

## 2015-07-08 DIAGNOSIS — Z8249 Family history of ischemic heart disease and other diseases of the circulatory system: Secondary | ICD-10-CM

## 2015-07-08 DIAGNOSIS — Z79899 Other long term (current) drug therapy: Secondary | ICD-10-CM | POA: Diagnosis not present

## 2015-07-08 DIAGNOSIS — J9600 Acute respiratory failure, unspecified whether with hypoxia or hypercapnia: Secondary | ICD-10-CM | POA: Diagnosis present

## 2015-07-08 DIAGNOSIS — M1990 Unspecified osteoarthritis, unspecified site: Secondary | ICD-10-CM | POA: Diagnosis present

## 2015-07-08 DIAGNOSIS — I10 Essential (primary) hypertension: Secondary | ICD-10-CM | POA: Diagnosis not present

## 2015-07-08 DIAGNOSIS — E785 Hyperlipidemia, unspecified: Secondary | ICD-10-CM | POA: Diagnosis present

## 2015-07-08 DIAGNOSIS — K219 Gastro-esophageal reflux disease without esophagitis: Secondary | ICD-10-CM | POA: Diagnosis present

## 2015-07-08 DIAGNOSIS — N183 Chronic kidney disease, stage 3 unspecified: Secondary | ICD-10-CM | POA: Diagnosis present

## 2015-07-08 DIAGNOSIS — Z794 Long term (current) use of insulin: Secondary | ICD-10-CM

## 2015-07-08 DIAGNOSIS — I5033 Acute on chronic diastolic (congestive) heart failure: Secondary | ICD-10-CM | POA: Diagnosis present

## 2015-07-08 DIAGNOSIS — M109 Gout, unspecified: Secondary | ICD-10-CM | POA: Diagnosis present

## 2015-07-08 DIAGNOSIS — R7989 Other specified abnormal findings of blood chemistry: Secondary | ICD-10-CM

## 2015-07-08 DIAGNOSIS — Z7902 Long term (current) use of antithrombotics/antiplatelets: Secondary | ICD-10-CM

## 2015-07-08 DIAGNOSIS — I2510 Atherosclerotic heart disease of native coronary artery without angina pectoris: Secondary | ICD-10-CM | POA: Diagnosis present

## 2015-07-08 DIAGNOSIS — IMO0001 Reserved for inherently not codable concepts without codable children: Secondary | ICD-10-CM

## 2015-07-08 DIAGNOSIS — E119 Type 2 diabetes mellitus without complications: Secondary | ICD-10-CM

## 2015-07-08 DIAGNOSIS — I252 Old myocardial infarction: Secondary | ICD-10-CM

## 2015-07-08 HISTORY — DX: Acute on chronic diastolic (congestive) heart failure: I50.33

## 2015-07-08 LAB — BASIC METABOLIC PANEL
ANION GAP: 8 (ref 5–15)
BUN: 29 mg/dL — ABNORMAL HIGH (ref 6–20)
CHLORIDE: 106 mmol/L (ref 101–111)
CO2: 26 mmol/L (ref 22–32)
Calcium: 9.7 mg/dL (ref 8.9–10.3)
Creatinine, Ser: 1.49 mg/dL — ABNORMAL HIGH (ref 0.44–1.00)
GFR calc non Af Amer: 36 mL/min — ABNORMAL LOW (ref >60)
GFR, EST AFRICAN AMERICAN: 42 mL/min — AB (ref >60)
Glucose, Bld: 82 mg/dL (ref 65–99)
POTASSIUM: 4.8 mmol/L (ref 3.5–5.1)
SODIUM: 140 mmol/L (ref 135–145)

## 2015-07-08 LAB — RAPID URINE DRUG SCREEN, HOSP PERFORMED
AMPHETAMINES: NOT DETECTED
BARBITURATES: NOT DETECTED
BENZODIAZEPINES: NOT DETECTED
Cocaine: NOT DETECTED
Opiates: NOT DETECTED
Tetrahydrocannabinol: NOT DETECTED

## 2015-07-08 LAB — BRAIN NATRIURETIC PEPTIDE: B NATRIURETIC PEPTIDE 5: 199.6 pg/mL — AB (ref 0.0–100.0)

## 2015-07-08 LAB — CBC
HEMATOCRIT: 29.2 % — AB (ref 36.0–46.0)
HEMOGLOBIN: 9.5 g/dL — AB (ref 12.0–15.0)
MCH: 30.6 pg (ref 26.0–34.0)
MCHC: 32.5 g/dL (ref 30.0–36.0)
MCV: 94.2 fL (ref 78.0–100.0)
PLATELETS: 332 10*3/uL (ref 150–400)
RBC: 3.1 MIL/uL — AB (ref 3.87–5.11)
RDW: 13.2 % (ref 11.5–15.5)
WBC: 8.4 10*3/uL (ref 4.0–10.5)

## 2015-07-08 LAB — I-STAT TROPONIN, ED: Troponin i, poc: 0.01 ng/mL (ref 0.00–0.08)

## 2015-07-08 LAB — APTT: APTT: 28 s (ref 24–37)

## 2015-07-08 LAB — TROPONIN I

## 2015-07-08 LAB — D-DIMER, QUANTITATIVE: D-Dimer, Quant: 1.51 ug{FEU}/mL — ABNORMAL HIGH (ref 0.00–0.50)

## 2015-07-08 LAB — CBG MONITORING, ED: GLUCOSE-CAPILLARY: 128 mg/dL — AB (ref 65–99)

## 2015-07-08 LAB — PROTIME-INR
INR: 1.07 (ref 0.00–1.49)
PROTHROMBIN TIME: 14.1 s (ref 11.6–15.2)

## 2015-07-08 MED ORDER — FUROSEMIDE 10 MG/ML IJ SOLN
40.0000 mg | Freq: Once | INTRAMUSCULAR | Status: AC
  Administered 2015-07-08: 40 mg via INTRAVENOUS
  Filled 2015-07-08: qty 4

## 2015-07-08 MED ORDER — CINNAMON 500 MG PO CAPS: 1000.0000 mg | ORAL_CAPSULE | Freq: Every day | ORAL | Status: DC

## 2015-07-08 MED ORDER — HEPARIN SODIUM (PORCINE) 5000 UNIT/ML IJ SOLN
5000.0000 [IU] | Freq: Three times a day (TID) | INTRAMUSCULAR | Status: DC
  Administered 2015-07-08 – 2015-07-10 (×5): 5000 [IU] via SUBCUTANEOUS
  Filled 2015-07-08 (×5): qty 1

## 2015-07-08 MED ORDER — METOPROLOL TARTRATE 25 MG PO TABS
25.0000 mg | ORAL_TABLET | Freq: Two times a day (BID) | ORAL | Status: DC
  Administered 2015-07-08 – 2015-07-10 (×4): 25 mg via ORAL
  Filled 2015-07-08 (×4): qty 1

## 2015-07-08 MED ORDER — NITROGLYCERIN 0.4 MG SL SUBL: 0.4000 mg | SUBLINGUAL_TABLET | SUBLINGUAL | Status: DC | PRN

## 2015-07-08 MED ORDER — ALLOPURINOL 100 MG PO TABS: 100.0000 mg | ORAL_TABLET | Freq: Every day | ORAL | Status: DC

## 2015-07-08 MED ORDER — ALLOPURINOL 100 MG PO TABS
100.0000 mg | ORAL_TABLET | Freq: Every day | ORAL | Status: DC
  Administered 2015-07-09 – 2015-07-10 (×2): 100 mg via ORAL
  Filled 2015-07-08 (×2): qty 1

## 2015-07-08 MED ORDER — VITAMIN D 1000 UNITS PO TABS
1000.0000 [IU] | ORAL_TABLET | Freq: Every day | ORAL | Status: DC
  Administered 2015-07-09 – 2015-07-10 (×2): 1000 [IU] via ORAL
  Filled 2015-07-08 (×2): qty 1

## 2015-07-08 MED ORDER — DEXTROSE 50 % IV SOLN
1.0000 | Freq: Once | INTRAVENOUS | Status: AC
  Administered 2015-07-08: 50 mL via INTRAVENOUS
  Filled 2015-07-08: qty 50

## 2015-07-08 MED ORDER — CLOPIDOGREL BISULFATE 75 MG PO TABS: 75.0000 mg | ORAL_TABLET | Freq: Every day | ORAL | Status: DC

## 2015-07-08 MED ORDER — SODIUM CHLORIDE 0.9 % IJ SOLN
3.0000 mL | Freq: Two times a day (BID) | INTRAMUSCULAR | Status: DC
  Administered 2015-07-08 – 2015-07-10 (×3): 3 mL via INTRAVENOUS

## 2015-07-08 MED ORDER — POLYSACCHARIDE IRON COMPLEX 150 MG PO CAPS
150.0000 mg | ORAL_CAPSULE | Freq: Every day | ORAL | Status: DC
  Administered 2015-07-09 – 2015-07-10 (×2): 150 mg via ORAL
  Filled 2015-07-08 (×3): qty 1

## 2015-07-08 MED ORDER — ASPIRIN 81 MG PO TABS: 81.0000 mg | ORAL_TABLET | Freq: Every day | ORAL | Status: DC

## 2015-07-08 MED ORDER — VENLAFAXINE HCL ER 75 MG PO CP24
75.0000 mg | ORAL_CAPSULE | Freq: Every day | ORAL | Status: DC
  Administered 2015-07-09 – 2015-07-10 (×2): 75 mg via ORAL
  Filled 2015-07-08 (×3): qty 1

## 2015-07-08 MED ORDER — FUROSEMIDE 10 MG/ML IJ SOLN
40.0000 mg | Freq: Two times a day (BID) | INTRAMUSCULAR | Status: DC
  Administered 2015-07-08 – 2015-07-09 (×3): 40 mg via INTRAVENOUS
  Filled 2015-07-08 (×3): qty 4

## 2015-07-08 MED ORDER — IOHEXOL 350 MG/ML SOLN
100.0000 mL | Freq: Once | INTRAVENOUS | Status: AC | PRN
  Administered 2015-07-08: 80 mL via INTRAVENOUS

## 2015-07-08 MED ORDER — DOXAZOSIN MESYLATE 4 MG PO TABS
4.0000 mg | ORAL_TABLET | Freq: Every day | ORAL | Status: DC
  Administered 2015-07-08 – 2015-07-09 (×2): 4 mg via ORAL
  Filled 2015-07-08 (×3): qty 1

## 2015-07-08 MED ORDER — ACETAMINOPHEN 325 MG PO TABS: 650.0000 mg | ORAL_TABLET | ORAL | Status: DC | PRN

## 2015-07-08 MED ORDER — ISOSORBIDE MONONITRATE ER 30 MG PO TB24
30.0000 mg | ORAL_TABLET | Freq: Every day | ORAL | Status: DC
  Administered 2015-07-09 – 2015-07-10 (×2): 30 mg via ORAL
  Filled 2015-07-08 (×2): qty 1

## 2015-07-08 MED ORDER — SODIUM CHLORIDE 0.9 % IJ SOLN: 3.0000 mL | INTRAMUSCULAR | Status: DC | PRN

## 2015-07-08 MED ORDER — SIMVASTATIN 20 MG PO TABS
20.0000 mg | ORAL_TABLET | Freq: Every day | ORAL | Status: DC
  Administered 2015-07-09: 20 mg via ORAL
  Filled 2015-07-08: qty 1

## 2015-07-08 MED ORDER — ONDANSETRON HCL 4 MG/2ML IJ SOLN: 4.0000 mg | Freq: Four times a day (QID) | INTRAMUSCULAR | Status: DC | PRN

## 2015-07-08 MED ORDER — ASPIRIN 81 MG PO CHEW
81.0000 mg | CHEWABLE_TABLET | Freq: Every day | ORAL | Status: DC
  Administered 2015-07-09 – 2015-07-10 (×2): 81 mg via ORAL
  Filled 2015-07-08 (×2): qty 1

## 2015-07-08 MED ORDER — FUROSEMIDE 10 MG/ML IJ SOLN: 40.0000 mg | Freq: Two times a day (BID) | INTRAMUSCULAR | Status: DC

## 2015-07-08 MED ORDER — LOSARTAN POTASSIUM 50 MG PO TABS
50.0000 mg | ORAL_TABLET | Freq: Every day | ORAL | Status: DC
  Administered 2015-07-09 – 2015-07-10 (×2): 50 mg via ORAL
  Filled 2015-07-08 (×2): qty 1

## 2015-07-08 MED ORDER — VITAMIN D 1000 UNITS PO TABS: 1000.0000 [IU] | ORAL_TABLET | Freq: Every day | ORAL | Status: DC

## 2015-07-08 MED ORDER — SODIUM CHLORIDE 0.9 % IV BOLUS (SEPSIS)
500.0000 mL | Freq: Once | INTRAVENOUS | Status: AC
  Administered 2015-07-08: 500 mL via INTRAVENOUS

## 2015-07-08 MED ORDER — SODIUM CHLORIDE 0.9 % IV SOLN: 250.0000 mL | INTRAVENOUS | Status: DC | PRN

## 2015-07-08 MED ORDER — CLOPIDOGREL BISULFATE 75 MG PO TABS
75.0000 mg | ORAL_TABLET | Freq: Every day | ORAL | Status: DC
  Administered 2015-07-09 – 2015-07-10 (×2): 75 mg via ORAL
  Filled 2015-07-08 (×2): qty 1

## 2015-07-08 MED ORDER — INSULIN GLARGINE 100 UNIT/ML ~~LOC~~ SOLN
12.0000 [IU] | Freq: Two times a day (BID) | SUBCUTANEOUS | Status: DC
  Administered 2015-07-08 – 2015-07-10 (×4): 12 [IU] via SUBCUTANEOUS
  Filled 2015-07-08 (×5): qty 0.12

## 2015-07-08 NOTE — Procedures (Signed)
Pt refuses the use of CPAP this evening.

## 2015-07-08 NOTE — ED Notes (Addendum)
Attempted report made by [facility] RN

## 2015-07-08 NOTE — ED Notes (Signed)
MD at bedside. 

## 2015-07-08 NOTE — Progress Notes (Signed)
Admitted pt from ED AAox4. Assisted to bed. VS taken and recorded. Tele on. Oriented to room and call bell.NSR on monitor. No c/o chest pain nor SOB.

## 2015-07-08 NOTE — H&P (Signed)
[facility] History and Physical  Courtney Pittman [id number] DOB: [birth year] DOA: 07/08/2015  Referring physician: ED physician PCP: [name], MD  Specialists:   Chief Complaint: Shortness of breath and leg edema  HPI: Courtney Pittman is a 64 y.o. female with PMH of diastolic congestive heart failure (EF 60-65% with grade 1 diastolic dysfunction), hypertension, hyperlipidemia, diabetes mellitus, GERD, gout, arthritis, CAD, melanoma, OSA on CPAP, CKD-3, who presents with shortness of breath.  Patient reports that she has been having worsening shortness of breath for about one week. Her leg edema is also getting worse. She has gained approximately 9 pounds recently. She has mild dry cough, but no chest pain, fever or chills. Patient does not have abdominal pain, diarrhea, symptoms of UTI or unilateral weakness. she reports that she has been compliant to her torsemide.   In ED, patient was found to have BNP 199, negative troponin, WBC 8.4, temperature normal, no tachycardia, stable renal function. CT angiogram of chest is negative for PE. Patient is admitted to inpatient for further evaluation and treatment.  CTA of chest: 1. Negative for acute pulmonary embolus. 2. Borderline cardiomegaly with scattered ground-glass attenuation opacities, interlobular septal thickening and small bilateral pleural effusions consistent with mild -moderate CHF. 3. A 1.6 cm rounded focal region of ground-glass attenuation opacity is present within the middle lobe. Differential considerations include focal alveolar pulmonary edema, a region of acute infectious/inflammatory consolidation, or less likely a low-grade bronchogenic adenocarcinoma. Recommend repeat imaging following an appropriate course of therapy and resolution of the patient's clinical symptoms. 4. Enlargement of the main and central pulmonary arteries as can be seen in the setting of pulmonary arterial hypertension. 5.  Multivessel coronary artery calcification. Please note that although the presence of coronary artery calcium documents the presence of coronary artery disease, the severity of this disease and any potential stenosis cannot be assessed on this non-gated CT examination. Assessment for potential risk factor modification, dietary therapy or pharmacologic therapy may be warranted, if clinically indicated. 6. Cholelithiasis. 7. Mild circumferential wall thickening of the mid and distal esophagus  Where does patient live?   At home    Can patient participate in ADLs?  Yes     Barely     None  Little     Some   Review of Systems:   General: no fevers, chills, no changes in body weight, has fatigue HEENT: no blurry vision, hearing changes or sore throat Pulm: has dyspnea, coughing, no wheezing CV: no chest pain, palpitations Abd: no nausea, vomiting, abdominal pain, diarrhea, constipation GU: no dysuria, burning on urination, increased urinary frequency, hematuria  Ext: has leg edema Neuro: no unilateral weakness, numbness, or tingling, no vision change or hearing loss Skin: no rash MSK: No muscle spasm, no deformity, no limitation of range of movement in spin Heme: No easy bruising.  Travel history: No recent long distant travel.  Allergy:  Allergies  Allergen Reactions  . Penicillins Swelling    Has patient had a PCN reaction causing immediate rash, facial/tongue/throat swelling, SOB or lightheadedness with hypotension: No Has patient had a PCN reaction causing severe rash involving mucus membranes or skin necrosis: No Has patient had a PCN reaction that required hospitalization No Has patient had a PCN reaction occurring within the last 10 years: No If all of the above answers are "NO", then may proceed with Cephalosporin use.    Past Medical History  Diagnosis Date  . Joint pain   . Arthritis   .  Vomiting   . HTN (hypertension) 03/20/2012  . DM (diabetes mellitus), type II  insulin dependent 03/20/2012  . Hyperlipidemia 03/20/2012  . GERD (gastroesophageal reflux disease) 03/20/2012  . Family history of premature coronary artery disease 03/20/2012  . Coronary artery disease     nnoncritical by cardiac catheterization performed by [name] radially in 2013  . Anemia   . Melanoma ([place]) 2014  . Colon polyps   . Obesity   . MI (myocardial infarction) ([place]) 20113    mild  . Hx of tubal ligation 1978  . History of left heart catheterization 03/2012  . Sleep apnea with use of continuous positive airway pressure (CPAP) 2013  . Renal insufficiency   . Acute on chronic diastolic (congestive) heart failure ([place]) 07/08/2015    Past Surgical History  Procedure Laterality Date  . Cesarean section  1974  . Tubal ligation  1974  . Eye surgery      cataracts both eyes  . Cardiac catheterization    . Melanoma excision  2014    x 2 back  . Left heart catheterization with coronary angiogram N/A 03/21/2012    Procedure: LEFT HEART CATHETERIZATION WITH CORONARY ANGIOGRAM;  Surgeon: [name], MD;  Location: [facility] CATH LAB;  Service: Cardiovascular;  Laterality: N/A;    Social History:  reports that she has never smoked. She has never used smokeless tobacco. She reports that she does not drink alcohol or use illicit drugs.  Family History:  Family History  Problem Relation Age of Onset  . Hypertension Sister   . Hypertension Brother   . Heart attack Father   . Diabetes Other     great aunts and uncles     Prior to Admission medications   Medication Sig Start Date End Date Taking? Authorizing Provider  allopurinol (ZYLOPRIM) 100 MG tablet Take 100 mg by mouth daily. 01/22/15  Yes Historical Provider, MD  aspirin 81 MG tablet Take 81 mg by mouth daily.     Yes Historical Provider, MD  Cholecalciferol (VITAMIN D PO) Take 1 tablet by mouth daily.   Yes Historical Provider, MD  Cinnamon 500 MG capsule Take 1,000 mg by mouth daily.   Yes Historical Provider, MD   clopidogrel (PLAVIX) 75 MG tablet Take 1 tablet (75 mg total) by mouth daily. <MUST KEEP APPOINTMENT 6/29> 02/04/15  Yes Lorretta Harp, MD  doxazosin (CARDURA) 4 MG tablet Take 4 mg by mouth at bedtime.     Yes Historical Provider, MD  FERREX 150 150 MG capsule Take 150 mg by mouth daily. 01/29/15  Yes Historical Provider, MD  insulin glargine (LANTUS) 100 UNIT/ML injection Inject 18-22 Units into the skin 2 (two) times daily. Take 22 units in the morning and 18 at night   Yes Historical Provider, MD  isosorbide mononitrate (IMDUR) 30 MG 24 hr tablet Take 1 tablet (30 mg total) by mouth daily. NEEDS APPOINTMENT FOR FUTURE REFILLS 11/22/14  Yes Lorretta Harp, MD  losartan (COZAAR) 50 MG tablet TAKE 1 TABLET BY MOUTH EVERY DAY 04/29/15  Yes Lorretta Harp, MD  metoprolol tartrate (LOPRESSOR) 25 MG tablet Take 25 mg by mouth 2 (two) times daily.     Yes Historical Provider, MD  NOVOLOG FLEXPEN 100 UNIT/ML FlexPen Inject 2-15 Units into the skin 3 (three) times daily with meals. Sliding scales 12/26/14  Yes Historical Provider, MD  simvastatin (ZOCOR) 20 MG tablet Take 1 tablet (20 mg total) by mouth daily at 6 PM. NEEDS APPOINTMENT  FOR FUTURE REFILLS Patient taking differently: Take 20 mg by mouth daily. NEEDS APPOINTMENT FOR FUTURE REFILLS 11/22/14  Yes Lorretta Harp, MD  torsemide (DEMADEX) 10 MG tablet Take 10 mg by mouth daily.   Yes Historical Provider, MD  venlafaxine XR (EFFEXOR-XR) 37.5 MG 24 hr capsule Take 75 mg by mouth daily.   Yes Historical Provider, MD  clopidogrel (PLAVIX) 75 MG tablet TAKE 1 TABLET BY MOUTH EVERY DAY (KEEP APPOINTMENT FOR FUTURE REFILLS) Patient not taking: Reported on 07/08/2015 04/08/15   Lorretta Harp, MD  NITROSTAT 0.4 MG SL tablet TAKE 1 TABLET BY MOUTH UNDER THE TONGUE EVERY 5MINUTES FOR 3 DOSES AS NEEDED FOR CHEST PAIN    Lorretta Harp, MD  ONE TOUCH ULTRA TEST test strip  04/13/11   Historical Provider, MD    Physical Exam: Filed Vitals:   07/08/15  1530 07/08/15 1600 07/08/15 1630 07/08/15 1730  BP: 176/53 179/56 183/61 192/58  Pulse: 76 73 73 78  Temp:      Resp: 22 22 18 21   Weight:      SpO2: 99% 98% 99% 100%   General: Not in acute distress HEENT:       Eyes: PERRL, EOMI, no scleral icterus.       ENT: No discharge from the ears and nose, no pharynx injection, no tonsillar enlargement.        Neck: positive JVD, no bruit, no mass felt. Heme: No neck lymph node enlargement. Cardiac: S1/S2, RRR, No murmurs, No gallops or rubs. Pulm: No rales, wheezing, rhonchi or rubs. Abd: Soft, nondistended, nontender, no rebound pain, no organomegaly, BS present. Ext: 1+ pitting leg edema bilaterally. 2+DP/PT pulse bilaterally. Musculoskeletal: No joint deformities, No joint redness or warmth, no limitation of ROM in spin. Skin: No rashes.  Neuro: Alert, oriented X3, cranial nerves II-XII grossly intact, muscle strength 5/5 in all extremities, sensation to light touch intact.  Psych: Patient is not psychotic, no suicidal or hemocidal ideation.  Labs on Admission:  Basic Metabolic Panel:  Recent Labs Lab 07/08/15 1209  NA 140  K 4.8  CL 106  CO2 26  GLUCOSE 82  BUN 29*  CREATININE 1.49*  CALCIUM 9.7   Liver Function Tests: No results for input(s): AST, ALT, ALKPHOS, BILITOT, PROT, ALBUMIN in the last 168 hours. No results for input(s): LIPASE, AMYLASE in the last 168 hours. No results for input(s): AMMONIA in the last 168 hours. CBC:  Recent Labs Lab 07/08/15 1209  WBC 8.4  HGB 9.5*  HCT 29.2*  MCV 94.2  PLT 332   Cardiac Enzymes: No results for input(s): CKTOTAL, CKMB, CKMBINDEX, TROPONINI in the last 168 hours.  BNP (last 3 results)  Recent Labs  07/08/15 1209  BNP 199.6*    ProBNP (last 3 results) No results for input(s): PROBNP in the last 8760 hours.  CBG:  Recent Labs Lab 07/08/15 1612  GLUCAP 128*    Radiological Exams on Admission: Ct Angio Chest Pe W/cm &/or Wo Cm  07/08/2015  CLINICAL  DATA:  64 year old female with progressive shortness of breath over the past week EXAM: CT ANGIOGRAPHY CHEST WITH CONTRAST TECHNIQUE: Multidetector CT imaging of the chest was performed using the standard protocol during bolus administration of intravenous contrast. Multiplanar CT image reconstructions and MIPs were obtained to evaluate the vascular anatomy. CONTRAST:  63mL OMNIPAQUE IOHEXOL 350 MG/ML SOLN COMPARISON:  None. FINDINGS: Mediastinum: Unremarkable CT appearance of the thyroid gland. No suspicious mediastinal or hilar adenopathy. No soft tissue  mediastinal mass. Mild circumferential wall thickening of the mid and distal esophagus. Heart/Vascular: Adequate opacification of the pulmonary arteries to the proximal subsegmental level. Evaluation of the segmental and subsegmental arteries is limited by respiratory motion artifact. No definite central filling defect to suggest acute pulmonary embolus. The main pulmonary artery is enlarged at 3.4 cm. Normal caliber aorta without evidence of aneurysm or dissection. Conventional 3 vessel arch anatomy. Calcifications are present along the course of the left anterior descending, circumflex and right coronary arteries. The heart is upper limits of normal limits for size. No pericardial effusion. Lungs/Pleura: Small bilateral layering pleural effusions with associated bibasilar atelectasis. Evaluation for small pulmonary nodules is limited by respiratory motion artifact. Scattered ground-glass attenuation opacities are present throughout the lungs bilaterally. The bilateral interlobular septal thickening. Nonspecific rounded ground-glass attenuation opacity in the middle lobe measures 1.6 cm in diameter. Diffuse mild bronchial wall thickening. Bones/Soft Tissues: No acute fracture or aggressive appearing lytic or blastic osseous lesion. Upper Abdomen: Small radiopaque densities layer within the gallbladder neck consistent with cholelithiasis. Otherwise, unremarkable  imaged upper abdomen. Review of the MIP images confirms the above findings. IMPRESSION: 1. Negative for acute pulmonary embolus. 2. Borderline cardiomegaly with scattered ground-glass attenuation opacities, interlobular septal thickening and small bilateral pleural effusions consistent with mild -moderate CHF. 3. A 1.6 cm rounded focal region of ground-glass attenuation opacity is present within the middle lobe. Differential considerations include focal alveolar pulmonary edema, a region of acute infectious/inflammatory consolidation, or less likely a low-grade bronchogenic adenocarcinoma. Recommend repeat imaging following an appropriate course of therapy and resolution of the patient's clinical symptoms. 4. Enlargement of the main and central pulmonary arteries as can be seen in the setting of pulmonary arterial hypertension. 5. Multivessel coronary artery calcification. Please note that although the presence of coronary artery calcium documents the presence of coronary artery disease, the severity of this disease and any potential stenosis cannot be assessed on this non-gated CT examination. Assessment for potential risk factor modification, dietary therapy or pharmacologic therapy may be warranted, if clinically indicated. 6. Cholelithiasis. 7. Mild circumferential wall thickening of the mid and distal esophagus. Query clinical history of GERD? Electronically Signed   By: Jacqulynn Cadet M.D.   On: 07/08/2015 17:40    EKG: Independently reviewed.  QTC 425, low-voltage  Assessment/Plan Principal Problem:   Acute on chronic diastolic (congestive) heart failure (HCC) Active Problems:   DM (diabetes mellitus), type II insulin dependent   HTN (hypertension)   Hyperlipidemia   GERD (gastroesophageal reflux disease)   Chronic kidney disease (CKD), stage III (moderate)   Obstructive sleep apnea   Heart failure (HCC)   SOB (shortness of breath)   Acute respiratory failure (HCC)  Acute on chronic  diastolic (congestive) heart failure ([place]): Patient's shortness of breath, leg edema and positive JVD are consistent with congestive heart failure exacerbation. Her BNP is not significantly elevated, which is likely due to obesity.  -will admit to tele bed -Lasix 40 mg bid by IV -trop x 3 -2d echo -Risk factor stratification: A1c, FLP, TSH -will continue home metoprolol, ASA, losartan -Daily weights -strict I/O's -Low salt diet  DM-II: Last A1c 7.0 on 03/20/12 well controled. Patient is taking Lantus at home -will decrease Lantus dose from 80-22 units to 12 units twice a day -SSI -Check A1c  CAD: No CP. -ASA, Zocor, metoprolol, Imdur  HTN (hypertension): -on lasix as above -Losartan, metoprolol, doxazosin  HLD: Last LDL was 56 ounce 12/23/11 -Continue home medications: Zocor -Check FLP  Chronic kidney disease (CKD), stage III (moderate): Baseline creatinine 1.7 on 03/23/12, her creatinine is 1.49, which is lower than previous one. -Follow-up renal function by BMP  OSA: -CPAP  Chronic anemia: Hemoglobin 9.5, likely due to chronic kidney disease -Anemia panel   DVT ppx: SQ Heparin    Code Status: Full code Family Communication: None at bed side.   Disposition Plan: Admit to inpatient   Date of Service 07/08/2015    Ivor Costa [facility] Pager [phone]  If 7PM-7AM, please contact night-coverage [email or web address] Password [facility] 07/08/2015, 6:34 PM

## 2015-07-08 NOTE — ED Notes (Signed)
Pt off unit with CT 

## 2015-07-08 NOTE — ED Notes (Addendum)
Pt here for SOB x 1 week and worsening today. sts exertional. Denies any pain. Sent here by [facility] with new onset CHF. sts she has also had some swelling in legs.

## 2015-07-08 NOTE — ED Notes (Signed)
Pt returned to room from CT

## 2015-07-08 NOTE — ED Notes (Signed)
Pt ambulated to the restroom in a steady manner 

## 2015-07-09 ENCOUNTER — Inpatient Hospital Stay (HOSPITAL_COMMUNITY): Payer: BLUE CROSS/BLUE SHIELD

## 2015-07-09 ENCOUNTER — Encounter (HOSPITAL_COMMUNITY): Payer: Self-pay | Admitting: Internal Medicine

## 2015-07-09 DIAGNOSIS — I509 Heart failure, unspecified: Secondary | ICD-10-CM

## 2015-07-09 LAB — LIPID PANEL
CHOL/HDL RATIO: 3.8 ratio
Cholesterol: 110 mg/dL (ref 0–200)
HDL: 29 mg/dL — ABNORMAL LOW (ref >40)
LDL Cholesterol: 59 mg/dL (ref 0–99)
Triglycerides: 111 mg/dL (ref <150)
VLDL: 22 mg/dL (ref 0–40)

## 2015-07-09 LAB — RETICULOCYTES
RBC.: 2.99 MIL/uL — ABNORMAL LOW (ref 3.87–5.11)
RETIC COUNT ABSOLUTE: 68.8 10*3/uL (ref 19.0–186.0)
RETIC CT PCT: 2.3 % (ref 0.4–3.1)

## 2015-07-09 LAB — BASIC METABOLIC PANEL
ANION GAP: 7 (ref 5–15)
BUN: 31 mg/dL — ABNORMAL HIGH (ref 6–20)
CALCIUM: 9.1 mg/dL (ref 8.9–10.3)
CO2: 30 mmol/L (ref 22–32)
CREATININE: 1.56 mg/dL — AB (ref 0.44–1.00)
Chloride: 103 mmol/L (ref 101–111)
GFR calc Af Amer: 40 mL/min — ABNORMAL LOW (ref >60)
GFR, EST NON AFRICAN AMERICAN: 35 mL/min — AB (ref >60)
GLUCOSE: 197 mg/dL — AB (ref 65–99)
Potassium: 4.1 mmol/L (ref 3.5–5.1)
Sodium: 140 mmol/L (ref 135–145)

## 2015-07-09 LAB — GLUCOSE, CAPILLARY
GLUCOSE-CAPILLARY: 398 mg/dL — AB (ref 65–99)
GLUCOSE-CAPILLARY: 400 mg/dL — AB (ref 65–99)

## 2015-07-09 LAB — TSH: TSH: 2.599 u[IU]/mL (ref 0.350–4.500)

## 2015-07-09 LAB — IRON AND TIBC
IRON: 32 ug/dL (ref 28–170)
Saturation Ratios: 11 % (ref 10.4–31.8)
TIBC: 288 ug/dL (ref 250–450)
UIBC: 256 ug/dL

## 2015-07-09 LAB — TROPONIN I: Troponin I: <0.03 ng/mL (ref <0.031)

## 2015-07-09 LAB — FOLATE: Folate: 15.3 ng/mL (ref >5.9)

## 2015-07-09 LAB — VITAMIN B12: Vitamin B-12: 287 pg/mL (ref 180–914)

## 2015-07-09 LAB — FERRITIN: FERRITIN: 61 ng/mL (ref 11–307)

## 2015-07-09 MED ORDER — FUROSEMIDE 10 MG/ML IJ SOLN
40.0000 mg | Freq: Every day | INTRAMUSCULAR | Status: DC
  Administered 2015-07-10: 40 mg via INTRAVENOUS
  Filled 2015-07-09: qty 4

## 2015-07-09 MED ORDER — INSULIN ASPART 100 UNIT/ML ~~LOC~~ SOLN
10.0000 [IU] | Freq: Once | SUBCUTANEOUS | Status: AC
  Administered 2015-07-09: 10 [IU] via SUBCUTANEOUS

## 2015-07-09 MED ORDER — INSULIN ASPART 100 UNIT/ML ~~LOC~~ SOLN
0.0000 [IU] | Freq: Three times a day (TID) | SUBCUTANEOUS | Status: DC
  Administered 2015-07-09: 9 [IU] via SUBCUTANEOUS
  Administered 2015-07-09: 1 [IU] via SUBCUTANEOUS
  Administered 2015-07-10: 2 [IU] via SUBCUTANEOUS
  Administered 2015-07-10: 9 [IU] via SUBCUTANEOUS

## 2015-07-09 NOTE — Progress Notes (Signed)
Patient refusing CPAP at this time

## 2015-07-09 NOTE — Progress Notes (Signed)
Heart Failure Navigator Consult Note  Presentation: Courtney Pittman is a 64 y.o. female with PMH of diastolic congestive heart failure (EF 60-65% with grade 1 diastolic dysfunction), hypertension, hyperlipidemia, diabetes mellitus, GERD, gout, arthritis, CAD, melanoma, OSA on CPAP, CKD-3, who presents with shortness of breath.  Patient reports that she has been having worsening shortness of breath for about one week. Her leg edema is also getting worse. She has gained approximately 9 pounds recently. She has mild dry cough, but no chest pain, fever or chills. Patient does not have abdominal pain, diarrhea, symptoms of UTI or unilateral weakness. she reports that she has been compliant to her torsemide.   Past Medical History  Diagnosis Date  . Joint pain   . Arthritis   . HTN (hypertension) 03/20/2012  . DM (diabetes mellitus), type II insulin dependent 03/20/2012  . Hyperlipidemia 03/20/2012  . GERD (gastroesophageal reflux disease) 03/20/2012  . Family history of premature coronary artery disease 03/20/2012  . Coronary artery disease     nnoncritical by cardiac catheterization performed by [name] radially in 2013  . Anemia   . Melanoma ([place]) 2014  . Colon polyps   . Obesity   . MI (myocardial infarction) ([place]) 20113    mild  . Hx of tubal ligation 1978  . History of left heart catheterization 03/2012  . Sleep apnea with use of continuous positive airway pressure (CPAP) 2013  . Acute on chronic diastolic (congestive) heart failure ([place]) 07/08/2015    Social History   Social History  . Marital Status: Married    Spouse Name: N/A  . Number of Children: 1A  . Years of Education: N/A   Occupational History  . admin assist    Social History Main Topics  . Smoking status: Never Smoker   . Smokeless tobacco: Never Used  . Alcohol Use: No  . Drug Use: No  . Sexual Activity: Yes   Other Topics Concern  . None   Social History Narrative   ** Merged History Encounter **         ECHO: pending  BNP    Component Value Date/Time   BNP 199.6* 07/08/2015 1209    ProBNP    Component Value Date/Time   PROBNP 5133.0* 03/20/2012 1829     Education Assessment and Provision:  Detailed education and instructions provided on heart failure disease management including the following:  Signs and symptoms of Heart Failure When to call the physician Importance of daily weights Low sodium diet Fluid restriction Medication management Anticipated future follow-up appointments  Patient education given on each of the above topics.  Patient acknowledges understanding and acceptance of all instructions.  I spoke with Courtney Pittman regarding her HF.  She says that she has had a MI in the past --yet has never been told that she had HF.  She does say that she weighs daily and avoids salt and sodium as much as possible since her MI in 2013.  We discussed the signs and symptoms of HF and how weight increases relate to HF.  I also reinforced a low sodium diet and high sodium foods to avoid.  She denies any issues getting or taking medications.  She says that she has been taking Demadex for years and she feels that it "does not work" anymore.  Sometimes she only urinates 2 x a day after taking.  We discussed that the dose may need to be adjusted in the future.  She follows with Dr Gwenlyn Found as  an outpatient.  Education Materials:  "Living Better With Heart Failure" Booklet, Daily Weight Tracker Tool    High Risk Criteria for Readmission and/or Poor Patient Outcomes:   EF <30%- pending  2 or more admissions in 6 months- No  Difficult social situation- No  Demonstrates medication noncompliance- no-denies    Barriers of Care:  New HF--Knowledge and compliance  Discharge Planning:   Plans to return home with husband and 82 year old granddaughter.

## 2015-07-09 NOTE — Progress Notes (Signed)
Nutrition Brief Note  Patient identified on the Malnutrition Screening Tool (MST) Report  Wt Readings from Last 15 Encounters:  07/09/15 197 lb 8 oz (89.585 kg)  05/10/15 197 lb (89.359 kg)  02/13/15 198 lb (89.812 kg)  03/20/14 197 lb 8 oz (89.585 kg)  11/07/13 203 lb (92.08 kg)  03/23/12 200 lb 8 oz (90.946 kg)  07/01/11 197 lb 6 oz (89.529 kg)    Body mass index is 37.34 kg/(m^2). Patient meets criteria for Obesity based on current BMI.   Current diet order is 2 Gram Sodium, patient is consuming approximately 100% of meals at this time. Pt states that her appetite is good and she is eating normally. She reports following a diabetic, low sodium diet PTA and denies any nutrition education needs at this time. RD encouraged intake of fresh fruits and vegetables, lean protein, low fat dairy and whole grains. Labs and medications reviewed.   No nutrition interventions warranted at this time. If nutrition issues arise, please consult RD.   Scarlette Ar RD, LDN Inpatient Clinical Dietitian Pager: [phone] After Hours Pager: [phone]

## 2015-07-09 NOTE — Progress Notes (Signed)
Patient ambulated in the hallway with minimal assistance on room air.  Oxygen saturation between 93-94%.  Patient complained of minimal shortness of breath and states that it has improved a lot since yesterday.

## 2015-07-09 NOTE — Progress Notes (Signed)
Utilization review completed. Constancia Geeting, RN, BSN. 

## 2015-07-09 NOTE — Progress Notes (Signed)
  Echocardiogram 2D Echocardiogram has been performed.  Courtney Pittman 07/09/2015, 3:44 PM

## 2015-07-09 NOTE — Progress Notes (Signed)
[facility] Progress Note   Courtney Pittman  [id number]  DOB: February 09, 1951  DOA: 07/08/2015 PCP: Courtney Stack, MD  Brief narrative: Courtney Pittman is a 64 y.o. female with a h/o grade 1 diastolic dysfunction, HTN, HLD, DM. CAD, OSA on CPAP, CKD 3 who is admitted with pulmonary edema.    Subjective: Dyspnea improved with diuretics. No chest pain. No cough or fevers. Has not had pedal edema.  Assessment/Plan: Principal Problem:   Acute on chronic diastolic (congestive) heart failure  - 2 D ECHO from 2013 reveals EF 123456 and Gr 1 diastolic dysfunction - still has crackles in bases on exam and mild dyspnea on exertion with pulse of 93-94% on exertion -  continue IV Lasix - reassess in AM  Active Problems:   DM (diabetes mellitus), type II insulin dependent - cont Lantus at lower dose and SSI- follow sugars    HTN (hypertension) - cont Losartan, Metoprolol, Doxazosin    Hyperlipidemia -cont Statin    Chronic kidney disease (CKD), stage III  -last 2 Cr in 2013 were 1.7 and 1.9- cont to  follow with diuresis  CAD - h/o NSTEMI- diagonal and distal crx disease not amenable to PCI - being managed medically by Dr Nigel Sloop- cont Plavix and Aspirin  OSA - will order CPAP   Code Status:     Code Status Orders        Start     Ordered   07/08/15 1940  Full code   Continuous     07/08/15 1941     Family Communication:  Disposition Plan: hopefully home in 1-2 days DVT prophylaxis: Heparin Consultants: none Procedures: Awaiting ECHO   Antibiotics: Anti-infectives    None      Objective: Filed Weights   07/08/15 1213 07/08/15 2031 07/09/15 0523  Weight: 95.255 kg (210 lb) 91.2 kg (201 lb 1 oz) 89.585 kg (197 lb 8 oz)    Intake/Output Summary (Last 24 hours) at 07/09/15 1230 Last data filed at 07/09/15 1031  Gross per 24 hour  Intake    723 ml  Output   2275 ml  Net  -1552 ml     Vitals Filed Vitals:   07/08/15 2031 07/09/15 0049 07/09/15  0523 07/09/15 1032  BP: 168/62 123/50 140/59 139/58  Pulse: 74 73 69 78  Temp:  98.2 F (36.8 C) 98.4 F (36.9 C)   TempSrc:  Oral Oral   Resp: 20 20 19 18   Height: 5\' 1"  (1.549 m)     Weight: 91.2 kg (201 lb 1 oz)  89.585 kg (197 lb 8 oz)   SpO2: 97% 92% 96% 96%    Exam:  General:  Pt is alert, not in acute distress  HEENT: No icterus, No thrush, oral mucosa moist  Cardiovascular: regular rate and rhythm, S1/S2 No murmur  Respiratory: crackles at bases  Abdomen: Soft, +Bowel sounds, non tender, non distended, no guarding  MSK: No LE edema, cyanosis or clubbing  Data Reviewed: Basic Metabolic Panel:  Recent Labs Lab 07/08/15 1209 07/09/15 0124  NA 140 140  K 4.8 4.1  CL 106 103  CO2 26 30  GLUCOSE 82 197*  BUN 29* 31*  CREATININE 1.49* 1.56*  CALCIUM 9.7 9.1   Liver Function Tests: No results for input(s): AST, ALT, ALKPHOS, BILITOT, PROT, ALBUMIN in the last 168 hours. No results for input(s): LIPASE, AMYLASE in the last 168 hours. No results for input(s): AMMONIA in the last 168 hours. CBC:  Recent Labs  Lab 07/08/15 1209  WBC 8.4  HGB 9.5*  HCT 29.2*  MCV 94.2  PLT 332   Cardiac Enzymes:  Recent Labs Lab 07/08/15 2057 07/09/15 0124 07/09/15 0730  TROPONINI <0.03 <0.03 <0.03   BNP (last 3 results)  Recent Labs  07/08/15 1209  BNP 199.6*    ProBNP (last 3 results) No results for input(s): PROBNP in the last 8760 hours.  CBG:  Recent Labs Lab 07/08/15 1612  GLUCAP 128*    No results found for this or any previous visit (from the past 240 hour(s)).   Studies: Ct Angio Chest Pe W/cm &/or Wo Cm  07/08/2015  CLINICAL DATA:  64 year old female with progressive shortness of breath over the past week EXAM: CT ANGIOGRAPHY CHEST WITH CONTRAST TECHNIQUE: Multidetector CT imaging of the chest was performed using the standard protocol during bolus administration of intravenous contrast. Multiplanar CT image reconstructions and MIPs were  obtained to evaluate the vascular anatomy. CONTRAST:  69mL OMNIPAQUE IOHEXOL 350 MG/ML SOLN COMPARISON:  None. FINDINGS: Mediastinum: Unremarkable CT appearance of the thyroid gland. No suspicious mediastinal or hilar adenopathy. No soft tissue mediastinal mass. Mild circumferential wall thickening of the mid and distal esophagus. Heart/Vascular: Adequate opacification of the pulmonary arteries to the proximal subsegmental level. Evaluation of the segmental and subsegmental arteries is limited by respiratory motion artifact. No definite central filling defect to suggest acute pulmonary embolus. The main pulmonary artery is enlarged at 3.4 cm. Normal caliber aorta without evidence of aneurysm or dissection. Conventional 3 vessel arch anatomy. Calcifications are present along the course of the left anterior descending, circumflex and right coronary arteries. The heart is upper limits of normal limits for size. No pericardial effusion. Lungs/Pleura: Small bilateral layering pleural effusions with associated bibasilar atelectasis. Evaluation for small pulmonary nodules is limited by respiratory motion artifact. Scattered ground-glass attenuation opacities are present throughout the lungs bilaterally. The bilateral interlobular septal thickening. Nonspecific rounded ground-glass attenuation opacity in the middle lobe measures 1.6 cm in diameter. Diffuse mild bronchial wall thickening. Bones/Soft Tissues: No acute fracture or aggressive appearing lytic or blastic osseous lesion. Upper Abdomen: Small radiopaque densities layer within the gallbladder neck consistent with cholelithiasis. Otherwise, unremarkable imaged upper abdomen. Review of the MIP images confirms the above findings. IMPRESSION: 1. Negative for acute pulmonary embolus. 2. Borderline cardiomegaly with scattered ground-glass attenuation opacities, interlobular septal thickening and small bilateral pleural effusions consistent with mild -moderate CHF. 3. A  1.6 cm rounded focal region of ground-glass attenuation opacity is present within the middle lobe. Differential considerations include focal alveolar pulmonary edema, a region of acute infectious/inflammatory consolidation, or less likely a low-grade bronchogenic adenocarcinoma. Recommend repeat imaging following an appropriate course of therapy and resolution of the patient's clinical symptoms. 4. Enlargement of the main and central pulmonary arteries as can be seen in the setting of pulmonary arterial hypertension. 5. Multivessel coronary artery calcification. Please note that although the presence of coronary artery calcium documents the presence of coronary artery disease, the severity of this disease and any potential stenosis cannot be assessed on this non-gated CT examination. Assessment for potential risk factor modification, dietary therapy or pharmacologic therapy may be warranted, if clinically indicated. 6. Cholelithiasis. 7. Mild circumferential wall thickening of the mid and distal esophagus. Query clinical history of GERD? Electronically Signed   By: Jacqulynn Cadet M.D.   On: 07/08/2015 17:40    Scheduled Meds:  Scheduled Meds: . allopurinol  100 mg Oral Daily  . aspirin  81 mg Oral Daily  .  cholecalciferol  1,000 Units Oral Daily  . clopidogrel  75 mg Oral Daily  . doxazosin  4 mg Oral QHS  . furosemide  40 mg Intravenous BID  . heparin  5,000 Units Subcutaneous 3 times per day  . insulin aspart  0-9 Units Subcutaneous TID WC  . insulin glargine  12 Units Subcutaneous BID  . iron polysaccharides  150 mg Oral Daily  . isosorbide mononitrate  30 mg Oral Daily  . losartan  50 mg Oral Daily  . metoprolol tartrate  25 mg Oral BID  . simvastatin  20 mg Oral q1800  . sodium chloride  3 mL Intravenous Q12H  . venlafaxine XR  75 mg Oral Daily   Continuous Infusions:   Time spent on care of this patient: 58 min   Townsend, MD 07/09/2015, 12:30 PM  LOS: 1 day   [facility] Office  [phone] Pager - Text Page per [email or web address] If 7PM-7AM, please contact night-coverage [email or web address]

## 2015-07-10 LAB — BASIC METABOLIC PANEL
Anion gap: 8 (ref 5–15)
BUN: 40 mg/dL — AB (ref 6–20)
CALCIUM: 9.2 mg/dL (ref 8.9–10.3)
CHLORIDE: 100 mmol/L — AB (ref 101–111)
CO2: 30 mmol/L (ref 22–32)
CREATININE: 1.76 mg/dL — AB (ref 0.44–1.00)
GFR calc Af Amer: 35 mL/min — ABNORMAL LOW (ref >60)
GFR calc non Af Amer: 30 mL/min — ABNORMAL LOW (ref >60)
Glucose, Bld: 150 mg/dL — ABNORMAL HIGH (ref 65–99)
Potassium: 3.7 mmol/L (ref 3.5–5.1)
SODIUM: 138 mmol/L (ref 135–145)

## 2015-07-10 LAB — GLUCOSE, CAPILLARY
GLUCOSE-CAPILLARY: 139 mg/dL — AB (ref 65–99)
Glucose-Capillary: 93 mg/dL (ref 65–99)
Glucose-Capillary: 139 mg/dL — ABNORMAL HIGH (ref 65–99)
Glucose-Capillary: 163 mg/dL — ABNORMAL HIGH (ref 65–99)
Glucose-Capillary: 358 mg/dL — ABNORMAL HIGH (ref 65–99)

## 2015-07-10 LAB — HEMOGLOBIN A1C
HEMOGLOBIN A1C: 7.1 % — AB (ref 4.8–5.6)
MEAN PLASMA GLUCOSE: 157 mg/dL

## 2015-07-10 MED ORDER — FUROSEMIDE 80 MG PO TABS: 80.0000 mg | ORAL_TABLET | Freq: Every day | ORAL | Status: DC

## 2015-07-10 NOTE — Progress Notes (Signed)
Notified MD of patient's qhs CBG 400. Received order to administer 10 units Novolog. Will continue to monitor patient. Tresa Endo

## 2015-07-10 NOTE — ED Provider Notes (Signed)
CSN: [id number]     Arrival date & time 07/08/15  1157 History   First MD Initiated Contact with Patient 07/08/15 1254     Chief Complaint  Patient presents with  . Shortness of Breath     (Consider location/radiation/quality/duration/timing/severity/associated sxs/prior Treatment) HPI   Courtney Pittman is a 64 y.o with a pmhx of DM, MI, CAD s/o cardiac cath 2013, HTN, HLD who presents to the ED today c/o SOB. Pt states that over the last week she has had progressively worsening DOE. However, pt states that today she was unable to walk even 6ft without becoming severely short of breath. Pt states that she has also had lower extremity swelling over the last week. This caused her presentation to a UC. UC sent her to the ED. Denies CP, palpitations, dizziness, syncope, abdominal pain, LE discoloration, headache, orthopnea. No known history of heart failure.   Past Medical History  Diagnosis Date  . Joint pain   . Arthritis   . HTN (hypertension) 03/20/2012  . DM (diabetes mellitus), type II insulin dependent 03/20/2012  . Hyperlipidemia 03/20/2012  . GERD (gastroesophageal reflux disease) 03/20/2012  . Family history of premature coronary artery disease 03/20/2012  . Coronary artery disease     nnoncritical by cardiac catheterization performed by [name] radially in 2013  . Anemia   . Melanoma ([place]) 2014  . Colon polyps   . Obesity   . MI (myocardial infarction) ([place]) 20113    mild  . Hx of tubal ligation 1978  . History of left heart catheterization 03/2012  . Sleep apnea with use of continuous positive airway pressure (CPAP) 2013  . Acute on chronic diastolic (congestive) heart failure ([place]) 07/08/2015   Past Surgical History  Procedure Laterality Date  . Cesarean section  1974  . Tubal ligation  1974  . Eye surgery      cataracts both eyes  . Cardiac catheterization    . Melanoma excision  2014    x 2 back  . Left heart catheterization with coronary angiogram N/A 03/21/2012   Procedure: LEFT HEART CATHETERIZATION WITH CORONARY ANGIOGRAM;  Surgeon: [name], MD;  Location: [facility] CATH LAB;  Service: Cardiovascular;  Laterality: N/A;   Family History  Problem Relation Age of Onset  . Hypertension Sister   . Hypertension Brother   . Heart attack Father   . Diabetes Other     great aunts and uncles   Social History  Substance Use Topics  . Smoking status: Never Smoker   . Smokeless tobacco: Never Used  . Alcohol Use: No   OB History    No data available     Review of Systems  All other systems reviewed and are negative.     Allergies  Penicillins  Home Medications   Prior to Admission medications   Medication Sig Start Date End Date Taking? Authorizing Provider  allopurinol (ZYLOPRIM) 100 MG tablet Take 100 mg by mouth daily. 01/22/15  Yes Historical Provider, MD  aspirin 81 MG tablet Take 81 mg by mouth daily.     Yes Historical Provider, MD  Cholecalciferol (VITAMIN D PO) Take 1 tablet by mouth daily.   Yes Historical Provider, MD  Cinnamon 500 MG capsule Take 1,000 mg by mouth daily.   Yes Historical Provider, MD  doxazosin (CARDURA) 4 MG tablet Take 4 mg by mouth at bedtime.     Yes Historical Provider, MD  FERREX 150 150 MG capsule Take 150 mg by mouth daily.  01/29/15  Yes Historical Provider, MD  insulin glargine (LANTUS) 100 UNIT/ML injection Inject 18-22 Units into the skin 2 (two) times daily. Take 22 units in the morning and 18 at night   Yes Historical Provider, MD  isosorbide mononitrate (IMDUR) 30 MG 24 hr tablet Take 1 tablet (30 mg total) by mouth daily. NEEDS APPOINTMENT FOR FUTURE REFILLS 11/22/14  Yes Lorretta Harp, MD  losartan (COZAAR) 50 MG tablet TAKE 1 TABLET BY MOUTH EVERY DAY 04/29/15  Yes Lorretta Harp, MD  metoprolol tartrate (LOPRESSOR) 25 MG tablet Take 25 mg by mouth 2 (two) times daily.     Yes Historical Provider, MD  NOVOLOG FLEXPEN 100 UNIT/ML FlexPen Inject 2-15 Units into the skin 3 (three) times daily with  meals. Sliding scales 12/26/14  Yes Historical Provider, MD  simvastatin (ZOCOR) 20 MG tablet Take 1 tablet (20 mg total) by mouth daily at 6 PM. NEEDS APPOINTMENT FOR FUTURE REFILLS Patient taking differently: Take 20 mg by mouth daily. NEEDS APPOINTMENT FOR FUTURE REFILLS 11/22/14  Yes Lorretta Harp, MD  venlafaxine XR (EFFEXOR-XR) 37.5 MG 24 hr capsule Take 75 mg by mouth daily.   Yes Historical Provider, MD  clopidogrel (PLAVIX) 75 MG tablet TAKE 1 TABLET BY MOUTH EVERY DAY (KEEP APPOINTMENT FOR FUTURE REFILLS) Patient not taking: Reported on 07/08/2015 04/08/15   Lorretta Harp, MD  furosemide (LASIX) 80 MG tablet Take 1 tablet (80 mg total) by mouth daily. 07/10/15   Nita Sells, MD  NITROSTAT 0.4 MG SL tablet TAKE 1 TABLET BY MOUTH UNDER THE TONGUE EVERY 5MINUTES FOR 3 DOSES AS NEEDED FOR CHEST PAIN    Lorretta Harp, MD  ONE TOUCH ULTRA TEST test strip  04/13/11   Historical Provider, MD   BP 147/66 mmHg  Pulse 72  Temp(Src) 98 F (36.7 C) (Oral)  Resp 18  Ht 5\' 1"  (1.549 m)  Wt 88.406 kg  BMI 36.85 kg/m2  SpO2 97% Physical Exam  Constitutional: She is oriented to person, place, and time. She appears well-developed and well-nourished. No distress.  HENT:  Head: Normocephalic and atraumatic.  Mouth/Throat: No oropharyngeal exudate.  Eyes: Conjunctivae and EOM are normal. Pupils are equal, round, and reactive to light. Right eye exhibits no discharge. Left eye exhibits no discharge. No scleral icterus.  Cardiovascular: Normal rate, regular rhythm, normal heart sounds and intact distal pulses.  Exam reveals no gallop and no friction rub.   No murmur heard. Pulmonary/Chest: Effort normal. No respiratory distress. She has no wheezes. She has no rales. She exhibits no tenderness.  Shallow breath sounds  Abdominal: Soft. She exhibits no distension. There is no tenderness. There is no guarding.  Musculoskeletal: Normal range of motion. She exhibits edema.  1 pitting edema  bilaterally. LLE with a larger circumference than R.  Neurological: She is alert and oriented to person, place, and time. No cranial nerve deficit.  Strength 5/5 throughout. No sensory deficits.    Skin: Skin is warm and dry. No rash noted. She is not diaphoretic. No erythema. No pallor.  Psychiatric: She has a normal mood and affect. Her behavior is normal.  Nursing note and vitals reviewed.   ED Course  Procedures (including critical care time) Labs Review Labs Reviewed  BASIC METABOLIC PANEL - Abnormal; Notable for the following:    BUN 29 (*)    Creatinine, Ser 1.49 (*)    GFR calc non Af Amer 36 (*)    GFR calc Af Amer 42 (*)  All other components within normal limits  CBC - Abnormal; Notable for the following:    RBC 3.10 (*)    Hemoglobin 9.5 (*)    HCT 29.2 (*)    All other components within normal limits  BRAIN NATRIURETIC PEPTIDE - Abnormal; Notable for the following:    B Natriuretic Peptide 199.6 (*)    All other components within normal limits  D-DIMER, QUANTITATIVE (NOT AT [facility]) - Abnormal; Notable for the following:    D-Dimer, Quant 1.51 (*)    All other components within normal limits  RETICULOCYTES - Abnormal; Notable for the following:    RBC. 2.99 (*)    All other components within normal limits  HEMOGLOBIN A1C - Abnormal; Notable for the following:    Hgb A1c MFr Bld 7.1 (*)    All other components within normal limits  BASIC METABOLIC PANEL - Abnormal; Notable for the following:    Glucose, Bld 197 (*)    BUN 31 (*)    Creatinine, Ser 1.56 (*)    GFR calc non Af Amer 35 (*)    GFR calc Af Amer 40 (*)    All other components within normal limits  LIPID PANEL - Abnormal; Notable for the following:    HDL 29 (*)    All other components within normal limits  BASIC METABOLIC PANEL - Abnormal; Notable for the following:    Chloride 100 (*)    Glucose, Bld 150 (*)    BUN 40 (*)    Creatinine, Ser 1.76 (*)    GFR calc non Af Amer 30 (*)    GFR calc  Af Amer 35 (*)    All other components within normal limits  GLUCOSE, CAPILLARY - Abnormal; Notable for the following:    Glucose-Capillary 398 (*)    All other components within normal limits  GLUCOSE, CAPILLARY - Abnormal; Notable for the following:    Glucose-Capillary 400 (*)    All other components within normal limits  GLUCOSE, CAPILLARY - Abnormal; Notable for the following:    Glucose-Capillary 139 (*)    All other components within normal limits  GLUCOSE, CAPILLARY - Abnormal; Notable for the following:    Glucose-Capillary 139 (*)    All other components within normal limits  GLUCOSE, CAPILLARY - Abnormal; Notable for the following:    Glucose-Capillary 163 (*)    All other components within normal limits  GLUCOSE, CAPILLARY - Abnormal; Notable for the following:    Glucose-Capillary 358 (*)    All other components within normal limits  CBG MONITORING, ED - Abnormal; Notable for the following:    Glucose-Capillary 128 (*)    All other components within normal limits  VITAMIN B12  FOLATE  IRON AND TIBC  FERRITIN  URINE RAPID DRUG SCREEN, HOSP PERFORMED  TSH  APTT  PROTIME-INR  TROPONIN I  TROPONIN I  TROPONIN I  GLUCOSE, CAPILLARY  I-STAT TROPOININ, ED    Imaging Review No results found. I have personally reviewed and evaluated these images and lab results as part of my medical decision-making.   EKG Interpretation   Date/Time:  Monday July 08 2015 12:08:41 EST Ventricular Rate:  68 PR Interval:  156 QRS Duration: 88 QT Interval:  400 QTC Calculation: 425 R Axis:   59 Text Interpretation:  Normal sinus rhythm Low voltage QRS Nonspecific ST  and T wave abnormality Abnormal ECG No previous ECGs available Confirmed  by NGUYEN, EMILY ([id number]) on 07/08/2015 1:01:12 PM  MDM   Final diagnoses:  D-dimer, elevated  Acute heart failure, unspecified heart failure type [facility])    64 y.o F with pmhx of MI, CAD present for acute onset DOE and LE  swelling. No known hx of heart failure. Upon review of pts record I cannot find documentation of heart failure. Last echo in 2013 showed EF 60-65%. Pt not tachycardic or hypoxic on exam. Pt is low risk Wells criteria. Will D-dimer due to SOB and LE swellling, L>R.   EKG wnl. Troponin negative.  D-dimer elevated 1.51 will proceed with CTA. CXR reviewed from UC visit. Pt brought this on a CD. Did not reveal any pulmonary edema or infililtrate. Overall negative CXR.  CTA negative for PE. Revealsscattered ground-glass opacities, interlobular septal thickening and small bilateral pleural effusions consistent with mild-mod CHF.  BNP elevated to 199. Given these findings will adminisiter 40mg  IV lasix, pt does not take this at home. Will admit pt for new onset HF.   Spoke with hospitalist who will admit pt for continued diuresis and new onset heart failure workup. Pt currently hemodynamically stable.  Patient was discussed with and seen by Dr. Alfonse Spruce who agrees with the treatment plan.    Dondra Spry [place], PA-C 07/10/15 [id number]  Harvel Quale, MD 07/11/15 [phone]

## 2015-07-10 NOTE — Progress Notes (Signed)
Results for BRIDGITTE, BASELICE (MRN [id number]) as of 07/10/2015 09:48  Ref. Range 07/09/2015 06:16 07/09/2015 12:21 07/09/2015 16:29 07/09/2015 21:18 07/10/2015 06:35  Glucose-Capillary Latest Ref Range: 65-99 mg/dL 93 139 (H) 398 (H) 400 (H) 163 (H)  Noted that CBGs have been greater than 180 mg/dl. Patient takes Lantus 22 units every am and 18 units every HS at home (total 40 units) along with Novolog 2-15 units TID per sliding scale.  Recommend increasing Novolog MODERATE correction scale TID and may need to increase Lantus to 15-20 units BID if CBGs continue to be greater than 180 mg/dl. Will continue to monitor blood sugars while in the hospital. Harvel Ricks RN BSN CDE

## 2015-07-10 NOTE — Progress Notes (Signed)
Patient refusing bed alarm during the night. Educated the patient on the importance of the bed alarm for safety purposes, patient still stated that she did not want alarm on at night. Will continue to monitor patient and round hourly offering bathroom assistance. Tresa Endo

## 2015-07-10 NOTE — Progress Notes (Signed)
Patient alert oriented, no c/o pain or shortness of breath. V/S stable, IV, and tele removed. discharge instruction explain and given to the patient, all questions answered, patient verbalized understanding. Will d/c patient home after eating lunch per order.

## 2015-07-10 NOTE — Discharge Summary (Signed)
Physician Discharge Summary  Courtney Pittman [id number] DOB: 11-Jun-1951 DOA: 07/08/2015  PCP: [name], MD  Admit date: 07/08/2015 Discharge date: 07/10/2015  Time spent: 45 minutes  Recommendations for Outpatient Follow-up:  1. changed Torsemide to lasix 80 this admit and needs Bmet in 1 week 2. Will need further education re: HF as OP 3. Continue insulin and follow up prn Dr. Forde Dandy for ty 1 DM    Discharge Diagnoses:  Principal Problem:   Acute on chronic diastolic (congestive) heart failure (HCC) Active Problems:   DM (diabetes mellitus), type II insulin dependent   HTN (hypertension)   Hyperlipidemia   GERD (gastroesophageal reflux disease)   Chronic kidney disease (CKD), stage III (moderate)   Heart failure (HCC)   Acute respiratory failure (HCC)   CHF exacerbation ([place])   Discharge Condition: improved   Diet recommendation: HH low salt and fluid restrict 1.5 liters  Filed Weights   07/08/15 2031 07/09/15 0523 07/10/15 0518  Weight: 91.2 kg (201 lb 1 oz) 89.585 kg (197 lb 8 oz) 88.406 kg (194 lb 14.4 oz)    History of present illness:  Courtney Pittman is a 64 y.o. female with a  h/o grade 1 diastolic dysfunction, HTN, HLD, DM. CAD  noncritical CAD by cath in 2000,  2013. OSA on CPAP obstructive sleep apnea intolerant to CPAP Remotely which she stopped because of mouth dryness.,  CKD 3 who is admitted with pulmonary edema 07/08/15  Hospital Course:    Acute on chronic diastolic (congestive) heart failure  - 2 D ECHO from 2013 reveals EF [id number] and Gr 1 diastolic dysfunction - no crackles and no LE edema on day of d/c and ambulatory in the halls -transitioned IV lasix 40 to 80 po daily on d/c home -discussed with her causes for Diastolic HF and needs for weight loss-she understands and will follow up with Dr. Gwenlyn Found as well as Dr. Rex Kras  Active Problems:  DM (diabetes mellitus), type II insulin dependent - cont home Lantus at lower dose and  SSI- follow sugars   HTN (hypertension) - cont Losartan, Metoprolol, Doxazosin as op   Hyperlipidemia -cont Statin   Chronic kidney disease (CKD), stage III  -last 2 Cr in 2013 were 1.7 and 1.9- cont to follow with diuresis as OP  CAD - h/o NSTEMI- diagonal and distal crx disease not amenable to PCI - being managed medically by Dr Nigel Sloop- cont Plavix and Aspirin  OSA - hasn't been using CPAP  -has  Had a rpt Sleep study as OP that was inconculusive for OSA -no further needs for CPAP   Discharge Exam: Filed Vitals:   07/09/15 2032 07/10/15 0518  BP: 156/49 152/51  Pulse: 75 72  Temp: 97.9 F (36.6 C) 98 F (36.7 C)  Resp: 18 18   Alert pleasant oriented in nad General: no distress doing well.  walekd the halls  Cardiovascular: s1 s2 no m/r/g, Tele showsNSR with rare pvc Respiratory:  Clear no added sound  Discharge Instructions   Discharge Instructions    Diet - low sodium heart healthy    Complete by:  As directed      Discharge instructions    Complete by:  As directed   We will change the demadex to lasix 80 mg on d/c-do not take any further demadex Please limit fluid intake to 1.5 liters a day and in the summer you can go up to 1.8 liters Please get lab work done at either PCP or Cardiology  office within 1 week     Increase activity slowly    Complete by:  As directed           Current Discharge Medication List    CONTINUE these medications which have NOT CHANGED   Details  allopurinol (ZYLOPRIM) 100 MG tablet Take 100 mg by mouth daily. Refills: 5    aspirin 81 MG tablet Take 81 mg by mouth daily.      Cholecalciferol (VITAMIN D PO) Take 1 tablet by mouth daily.    Cinnamon 500 MG capsule Take 1,000 mg by mouth daily.    doxazosin (CARDURA) 4 MG tablet Take 4 mg by mouth at bedtime.      FERREX 150 150 MG capsule Take 150 mg by mouth daily. Refills: 5    insulin glargine (LANTUS) 100 UNIT/ML injection Inject 18-22 Units into the skin 2  (two) times daily. Take 22 units in the morning and 18 at night    isosorbide mononitrate (IMDUR) 30 MG 24 hr tablet Take 1 tablet (30 mg total) by mouth daily. NEEDS APPOINTMENT FOR FUTURE REFILLS Qty: 30 tablet, Refills: 0    losartan (COZAAR) 50 MG tablet TAKE 1 TABLET BY MOUTH EVERY DAY Qty: 30 tablet, Refills: 7    metoprolol tartrate (LOPRESSOR) 25 MG tablet Take 25 mg by mouth 2 (two) times daily.      NOVOLOG FLEXPEN 100 UNIT/ML FlexPen Inject 2-15 Units into the skin 3 (three) times daily with meals. Sliding scales Refills: 6    simvastatin (ZOCOR) 20 MG tablet Take 1 tablet (20 mg total) by mouth daily at 6 PM. NEEDS APPOINTMENT FOR FUTURE REFILLS Qty: 30 tablet, Refills: 0    venlafaxine XR (EFFEXOR-XR) 37.5 MG 24 hr capsule Take 75 mg by mouth daily.    clopidogrel (PLAVIX) 75 MG tablet TAKE 1 TABLET BY MOUTH EVERY DAY (KEEP APPOINTMENT FOR FUTURE REFILLS) Qty: 30 tablet, Refills: 10    NITROSTAT 0.4 MG SL tablet TAKE 1 TABLET BY MOUTH UNDER THE TONGUE EVERY 5MINUTES FOR 3 DOSES AS NEEDED FOR CHEST PAIN Qty: 25 tablet, Refills: 1    ONE TOUCH ULTRA TEST test strip       STOP taking these medications     torsemide (DEMADEX) 10 MG tablet        Allergies  Allergen Reactions  . Penicillins Swelling    Has patient had a PCN reaction causing immediate rash, facial/tongue/throat swelling, SOB or lightheadedness with hypotension: No Has patient had a PCN reaction causing severe rash involving mucus membranes or skin necrosis: No Has patient had a PCN reaction that required hospitalization No Has patient had a PCN reaction occurring within the last 10 years: No If all of the above answers are "NO", then may proceed with Cephalosporin use.      The results of significant diagnostics from this hospitalization (including imaging, microbiology, ancillary and laboratory) are listed below for reference.    Significant Diagnostic Studies: Ct Angio Chest Pe W/cm &/or Wo  Cm  07/08/2015  CLINICAL DATA:  64 year old female with progressive shortness of breath over the past week EXAM: CT ANGIOGRAPHY CHEST WITH CONTRAST TECHNIQUE: Multidetector CT imaging of the chest was performed using the standard protocol during bolus administration of intravenous contrast. Multiplanar CT image reconstructions and MIPs were obtained to evaluate the vascular anatomy. CONTRAST:  78mL OMNIPAQUE IOHEXOL 350 MG/ML SOLN COMPARISON:  None. FINDINGS: Mediastinum: Unremarkable CT appearance of the thyroid gland. No suspicious mediastinal or hilar adenopathy. No soft tissue  mediastinal mass. Mild circumferential wall thickening of the mid and distal esophagus. Heart/Vascular: Adequate opacification of the pulmonary arteries to the proximal subsegmental level. Evaluation of the segmental and subsegmental arteries is limited by respiratory motion artifact. No definite central filling defect to suggest acute pulmonary embolus. The main pulmonary artery is enlarged at 3.4 cm. Normal caliber aorta without evidence of aneurysm or dissection. Conventional 3 vessel arch anatomy. Calcifications are present along the course of the left anterior descending, circumflex and right coronary arteries. The heart is upper limits of normal limits for size. No pericardial effusion. Lungs/Pleura: Small bilateral layering pleural effusions with associated bibasilar atelectasis. Evaluation for small pulmonary nodules is limited by respiratory motion artifact. Scattered ground-glass attenuation opacities are present throughout the lungs bilaterally. The bilateral interlobular septal thickening. Nonspecific rounded ground-glass attenuation opacity in the middle lobe measures 1.6 cm in diameter. Diffuse mild bronchial wall thickening. Bones/Soft Tissues: No acute fracture or aggressive appearing lytic or blastic osseous lesion. Upper Abdomen: Small radiopaque densities layer within the gallbladder neck consistent with  cholelithiasis. Otherwise, unremarkable imaged upper abdomen. Review of the MIP images confirms the above findings. IMPRESSION: 1. Negative for acute pulmonary embolus. 2. Borderline cardiomegaly with scattered ground-glass attenuation opacities, interlobular septal thickening and small bilateral pleural effusions consistent with mild -moderate CHF. 3. A 1.6 cm rounded focal region of ground-glass attenuation opacity is present within the middle lobe. Differential considerations include focal alveolar pulmonary edema, a region of acute infectious/inflammatory consolidation, or less likely a low-grade bronchogenic adenocarcinoma. Recommend repeat imaging following an appropriate course of therapy and resolution of the patient's clinical symptoms. 4. Enlargement of the main and central pulmonary arteries as can be seen in the setting of pulmonary arterial hypertension. 5. Multivessel coronary artery calcification. Please note that although the presence of coronary artery calcium documents the presence of coronary artery disease, the severity of this disease and any potential stenosis cannot be assessed on this non-gated CT examination. Assessment for potential risk factor modification, dietary therapy or pharmacologic therapy may be warranted, if clinically indicated. 6. Cholelithiasis. 7. Mild circumferential wall thickening of the mid and distal esophagus. Query clinical history of GERD? Electronically Signed   By: Jacqulynn Cadet M.D.   On: 07/08/2015 17:40    Microbiology: No results found for this or any previous visit (from the past 240 hour(s)).   Labs: Basic Metabolic Panel:  Recent Labs Lab 07/08/15 1209 07/09/15 0124 07/10/15 0325  NA 140 140 138  K 4.8 4.1 3.7  CL 106 103 100*  CO2 26 30 30   GLUCOSE 82 197* 150*  BUN 29* 31* 40*  CREATININE 1.49* 1.56* 1.76*  CALCIUM 9.7 9.1 9.2   Liver Function Tests: No results for input(s): AST, ALT, ALKPHOS, BILITOT, PROT, ALBUMIN in the last  168 hours. No results for input(s): LIPASE, AMYLASE in the last 168 hours. No results for input(s): AMMONIA in the last 168 hours. CBC:  Recent Labs Lab 07/08/15 1209  WBC 8.4  HGB 9.5*  HCT 29.2*  MCV 94.2  PLT 332   Cardiac Enzymes:  Recent Labs Lab 07/08/15 2057 07/09/15 0124 07/09/15 0730  TROPONINI <0.03 <0.03 <0.03   BNP: BNP (last 3 results)  Recent Labs  07/08/15 1209  BNP 199.6*    ProBNP (last 3 results) No results for input(s): PROBNP in the last 8760 hours.  CBG:  Recent Labs Lab 07/09/15 0616 07/09/15 1221 07/09/15 1629 07/09/15 2118 07/10/15 0635  GLUCAP 93 139* 398* 400* 163*  SignedNita Sells  [facility] 07/10/2015, 10:00 AM

## 2015-07-17 ENCOUNTER — Ambulatory Visit (INDEPENDENT_AMBULATORY_CARE_PROVIDER_SITE_OTHER): Payer: BLUE CROSS/BLUE SHIELD | Admitting: Cardiovascular Disease

## 2015-07-17 ENCOUNTER — Encounter: Payer: Self-pay | Admitting: Cardiovascular Disease

## 2015-07-17 VITALS — BP 140/62 | HR 62 | Ht 61.0 in | Wt 193.0 lb

## 2015-07-17 DIAGNOSIS — E785 Hyperlipidemia, unspecified: Secondary | ICD-10-CM | POA: Diagnosis not present

## 2015-07-17 DIAGNOSIS — I5033 Acute on chronic diastolic (congestive) heart failure: Secondary | ICD-10-CM | POA: Diagnosis not present

## 2015-07-17 DIAGNOSIS — I10 Essential (primary) hypertension: Secondary | ICD-10-CM | POA: Diagnosis not present

## 2015-07-17 NOTE — Assessment & Plan Note (Signed)
Courtney Pittman was recently admitted 07/08/15 through the Q000111Q with a diastolic heart failure. She was diuresed and her torsemide was changed to furosemide 80 mg a day. She is a widow of salt restriction. She feels chronically improved.

## 2015-07-17 NOTE — Progress Notes (Signed)
07/17/2015 Courtney Pittman   12-21-1950  MU:[id number]  Primary Physician [name], MD Primary Cardiologist: [name] [name]   HPI:  The patient is a 64 year old moderately overweight married Caucasian female mother of 69, grandmother to 1 grandchild who I last saw in the office 02/13/15.Marland Kitchen She has a history of noncritical CAD by cath in 2000, 2008, and again last year performed radially by Dr. Glenetta Hew March 20, 2012. Her other problems include obstructive sleep apnea intolerant to CPAP and was on C Pap Remotely which she stopped because of mouth dryness., hypertension, hyperlipidemia and insulin-dependent diabetes. She really denies chest pain but has had some back pain which I suspect is noncardiac. Her most recent lipid profile performed 07/09/15 revealed total cholesterol 110, LDL of 59 and HDL 29. She was recently admitted to [facility] on [id number] with diastolic heart failure. She was diuresed and discharged to home today's later. Her torsemide was changed to furosemide 80 mg a day. She feels clinically improved.   Current Outpatient Prescriptions  Medication Sig Dispense Refill  . allopurinol (ZYLOPRIM) 100 MG tablet Take 100 mg by mouth daily.  5  . aspirin 81 MG tablet Take 81 mg by mouth daily.      . Cholecalciferol (VITAMIN D PO) Take 1 tablet by mouth daily.    . Cinnamon 500 MG capsule Take 1,000 mg by mouth daily.    . clopidogrel (PLAVIX) 75 MG tablet TAKE 1 TABLET BY MOUTH EVERY DAY (KEEP APPOINTMENT FOR FUTURE REFILLS) 30 tablet 10  . doxazosin (CARDURA) 4 MG tablet Take 4 mg by mouth at bedtime.      Marland Kitchen FERREX 150 150 MG capsule Take 150 mg by mouth daily.  5  . furosemide (LASIX) 80 MG tablet Take 1 tablet (80 mg total) by mouth daily. 30 tablet 0  . insulin glargine (LANTUS) 100 UNIT/ML injection Inject 18-22 Units into the skin 2 (two) times daily. Take 22 units in the morning and 18 at night    . isosorbide mononitrate  (IMDUR) 30 MG 24 hr tablet Take 1 tablet (30 mg total) by mouth daily. NEEDS APPOINTMENT FOR FUTURE REFILLS 30 tablet 0  . losartan (COZAAR) 50 MG tablet TAKE 1 TABLET BY MOUTH EVERY DAY 30 tablet 7  . metoprolol tartrate (LOPRESSOR) 25 MG tablet Take 25 mg by mouth 2 (two) times daily.      Marland Kitchen NITROSTAT 0.4 MG SL tablet TAKE 1 TABLET BY MOUTH UNDER THE TONGUE EVERY 5MINUTES FOR 3 DOSES AS NEEDED FOR CHEST PAIN 25 tablet 1  . NOVOLOG FLEXPEN 100 UNIT/ML FlexPen Inject 2-15 Units into the skin 3 (three) times daily with meals. Sliding scales  6  . ONE TOUCH ULTRA TEST test strip     . simvastatin (ZOCOR) 20 MG tablet Take 1 tablet (20 mg total) by mouth daily at 6 PM. NEEDS APPOINTMENT FOR FUTURE REFILLS (Patient taking differently: Take 20 mg by mouth daily. NEEDS APPOINTMENT FOR FUTURE REFILLS) 30 tablet 0  . venlafaxine XR (EFFEXOR-XR) 37.5 MG 24 hr capsule Take 75 mg by mouth daily.     No current facility-administered medications for this visit.    Allergies  Allergen Reactions  . Penicillins Swelling    Has patient had a PCN reaction causing immediate rash, facial/tongue/throat swelling, SOB or lightheadedness with hypotension: No Has patient had a PCN reaction causing severe rash involving mucus membranes or skin necrosis: No Has patient had a PCN reaction  that required hospitalization No Has patient had a PCN reaction occurring within the last 10 years: No If all of the above answers are "NO", then may proceed with Cephalosporin use.    Social History   Social History  . Marital Status: Married    Spouse Name: N/A  . Number of Children: 1A  . Years of Education: N/A   Occupational History  . admin assist    Social History Main Topics  . Smoking status: Never Smoker   . Smokeless tobacco: Never Used  . Alcohol Use: No  . Drug Use: No  . Sexual Activity: Yes   Other Topics Concern  . Not on file   Social History Narrative   ** Merged History Encounter **          Review of Systems: General: negative for chills, fever, night sweats or weight changes.  Cardiovascular: negative for chest pain, dyspnea on exertion, edema, orthopnea, palpitations, paroxysmal nocturnal dyspnea or shortness of breath Dermatological: negative for rash Respiratory: negative for cough or wheezing Urologic: negative for hematuria Abdominal: negative for nausea, vomiting, diarrhea, bright red blood per rectum, melena, or hematemesis Neurologic: negative for visual changes, syncope, or dizziness All other systems reviewed and are otherwise negative except as noted above.    Blood pressure 140/62, pulse 62, height 5\' 1"  (1.549 m), weight 193 lb (87.544 kg).  General appearance: alert and no distress Neck: no adenopathy, no carotid bruit, no JVD, supple, symmetrical, trachea midline and thyroid not enlarged, symmetric, no tenderness/mass/nodules Lungs: clear to auscultation bilaterally Heart: regular rate and rhythm, S1, S2 normal, no murmur, click, rub or gallop Extremities: extremities normal, atraumatic, no cyanosis or edema  EKG not performed today  ASSESSMENT AND PLAN:   Hyperlipidemia History of hyperlipidemia on simvastatin with recent lipid profile performed 07/09/15 revealing a total cholesterol 110, LDL 59 and HDL of 29  HTN (hypertension) History of hypertension blood pressure measured at 140/62. She is on losartan and metoprolol. Continue current meds at current dosing  Acute on chronic diastolic (congestive) heart failure [facility]) Ms. Iiams was recently admitted 07/08/15 through the Q000111Q with a diastolic heart failure. She was diuresed and her torsemide was changed to furosemide 80 mg a day. She is a widow of salt restriction. She feels chronically improved.      [name] FACP,FACC,FAHA, [facility] ([place]) 07/17/2015 4:40 PM

## 2015-07-17 NOTE — Assessment & Plan Note (Signed)
History of hyperlipidemia on simvastatin with recent lipid profile performed 07/09/15 revealing a total cholesterol 110, LDL 59 and HDL of 29

## 2015-07-17 NOTE — Patient Instructions (Signed)
Medication Instructions:  Your physician recommends that you continue on your current medications as directed. Please refer to the Current Medication list given to you today.   Labwork: none  Testing/Procedures: none  Follow-Up: We request that you follow-up in: 6 months  with an extender and in 12 month with Dr Andria Rhein will receive a reminder letter in the mail two months in advance. If you don't receive a letter, please call our office to schedule the follow-up appointment.   Any Other Special Instructions Will Be Listed Below (If Applicable).     If you need a refill on your cardiac medications before your next appointment, please call your pharmacy.

## 2015-07-17 NOTE — Assessment & Plan Note (Signed)
History of hypertension blood pressure measured at 140/62. She is on losartan and metoprolol. Continue current meds at current dosing

## 2015-08-06 ENCOUNTER — Other Ambulatory Visit: Payer: Self-pay | Admitting: Cardiovascular Disease

## 2015-10-16 ENCOUNTER — Other Ambulatory Visit: Payer: Self-pay | Admitting: Endocrinology

## 2015-10-16 DIAGNOSIS — R911 Solitary pulmonary nodule: Secondary | ICD-10-CM

## 2015-10-22 ENCOUNTER — Ambulatory Visit
Admission: RE | Admit: 2015-10-22 | Discharge: 2015-10-22 | Disposition: A | Discharge status: Home / Self Care | Payer: Commercial Managed Care - PPO | Source: Ambulatory Visit | Attending: Endocrinology | Admitting: Endocrinology

## 2015-10-22 ENCOUNTER — Other Ambulatory Visit: Payer: Self-pay | Admitting: Endocrinology

## 2015-10-22 DIAGNOSIS — R911 Solitary pulmonary nodule: Secondary | ICD-10-CM

## 2016-03-05 ENCOUNTER — Other Ambulatory Visit: Payer: Self-pay | Admitting: Cardiovascular Disease

## 2016-05-28 ENCOUNTER — Encounter: Payer: Self-pay | Admitting: Gastroenterology

## 2016-07-09 ENCOUNTER — Other Ambulatory Visit: Payer: Self-pay | Admitting: Cardiovascular Disease

## 2016-07-13 NOTE — Telephone Encounter (Signed)
Rx(s) sent to pharmacy electronically.  

## 2016-08-07 ENCOUNTER — Other Ambulatory Visit: Payer: Self-pay | Admitting: Cardiovascular Disease

## 2016-08-16 ENCOUNTER — Other Ambulatory Visit: Payer: Self-pay | Admitting: Cardiovascular Disease

## 2016-08-18 NOTE — Telephone Encounter (Signed)
Rx has been sent to the pharmacy electronically. ° °

## 2016-09-01 ENCOUNTER — Telehealth: Payer: Self-pay | Admitting: Cardiovascular Disease

## 2016-09-01 NOTE — Telephone Encounter (Signed)
Received ov note from [place], [place] dated 08/25/16. Including recent labwork. Will have scanned in to pt chart.   Pt overdue for annual f/u with Dr. Gwenlyn Found. Sent message to scheduling.

## 2016-09-03 ENCOUNTER — Other Ambulatory Visit: Payer: Self-pay | Admitting: Cardiovascular Disease

## 2016-09-04 NOTE — Telephone Encounter (Signed)
Rx(s) sent to pharmacy electronically.  

## 2016-09-07 ENCOUNTER — Other Ambulatory Visit: Payer: Self-pay | Admitting: Cardiovascular Disease

## 2016-09-30 ENCOUNTER — Encounter: Payer: Self-pay | Admitting: Cardiovascular Disease

## 2016-09-30 ENCOUNTER — Ambulatory Visit (INDEPENDENT_AMBULATORY_CARE_PROVIDER_SITE_OTHER): Payer: Commercial Managed Care - PPO | Admitting: Cardiovascular Disease

## 2016-09-30 VITALS — BP 181/72 | HR 71 | Ht 61.0 in | Wt 197.6 lb

## 2016-09-30 DIAGNOSIS — I10 Essential (primary) hypertension: Secondary | ICD-10-CM

## 2016-09-30 DIAGNOSIS — I214 Non-ST elevation (NSTEMI) myocardial infarction: Secondary | ICD-10-CM

## 2016-09-30 MED ORDER — METOPROLOL TARTRATE 25 MG PO TABS: 25.0000 mg | ORAL_TABLET | Freq: Two times a day (BID) | ORAL | 11 refills | Status: DC

## 2016-09-30 MED ORDER — NITROGLYCERIN 0.4 MG SL SUBL: 0.4000 mg | SUBLINGUAL_TABLET | SUBLINGUAL | 2 refills | Status: AC | PRN

## 2016-09-30 MED ORDER — FUROSEMIDE 80 MG PO TABS: 80.0000 mg | ORAL_TABLET | Freq: Every day | ORAL | 11 refills | Status: DC

## 2016-09-30 MED ORDER — LOSARTAN POTASSIUM 50 MG PO TABS: 50.0000 mg | ORAL_TABLET | Freq: Every day | ORAL | 11 refills | Status: DC

## 2016-09-30 MED ORDER — SIMVASTATIN 20 MG PO TABS: 20.0000 mg | ORAL_TABLET | Freq: Every day | ORAL | 11 refills | Status: DC

## 2016-09-30 MED ORDER — ISOSORBIDE MONONITRATE ER 30 MG PO TB24: 30.0000 mg | ORAL_TABLET | Freq: Every day | ORAL | 11 refills | Status: DC

## 2016-09-30 NOTE — Assessment & Plan Note (Signed)
History of hyperlipidemia on statin therapy with lipid profile performed 07/07/15 revealing a total cholesterol 110, LDL 59 and HDL 29. This followed by her PCP

## 2016-09-30 NOTE — Patient Instructions (Signed)
Medication Instructions: Discontinue Plavix.  Follow-Up: Your physician wants you to follow-up in: 1 year with Dr. Gwenlyn Found. You will receive a reminder letter in the mail two months in advance. If you don't receive a letter, please call our office to schedule the follow-up appointment.  If you need a refill on your cardiac medications before your next appointment, please call your pharmacy.

## 2016-09-30 NOTE — Assessment & Plan Note (Signed)
History of hypertension blood pressure measured at 181/72. She is on doxazosin, losartan and metoprolol however unfortunately she has run out of her blood pressure medicines. We will refill her prescription.

## 2016-09-30 NOTE — Assessment & Plan Note (Signed)
History of multiple cardiac catheterizations beginning in 2000 and again 2008 and finally 03/20/12. She denies chest pain or shortness of breath.

## 2016-09-30 NOTE — Progress Notes (Signed)
09/30/2016 Courtney Pittman   March 03, 1951  [id number]  Primary Physician [name], MD Primary Cardiologist: [name] [name]  HPI:  The patient is a 66 year old moderately overweight married Caucasian female mother of 26, grandmother to 1 grandchild who I last saw in the office 07/17/15.Marland Kitchen She has a history of noncritical CAD by cath in 2000, 2008, and again last year performed radially by Dr. Glenetta Hew March 20, 2012. Her other problems include obstructive sleep apnea intolerant to CPAP and was on C Pap Remotely which she stopped because of mouth dryness., hypertension, hyperlipidemia and insulin-dependent diabetes. She really denies chest pain but has had some back pain which I suspect is noncardiac. Her most recent lipid profile performed 07/09/15 revealed total cholesterol 110, LDL of 59 and HDL 29. She was recently admitted to [facility] on [id number] with diastolic heart failure. She was diuresed and discharged to home today's later. Her torsemide was changed to furosemide 80 mg a day. She feels clinically improved. Since I saw her a year and a half ago she's remained clinically stable   Current Outpatient Prescriptions  Medication Sig Dispense Refill  . aspirin 81 MG tablet Take 81 mg by mouth daily.      . Cholecalciferol (VITAMIN D PO) Take 1 tablet by mouth daily.    . Cinnamon 500 MG capsule Take 1,000 mg by mouth daily.    Marland Kitchen doxazosin (CARDURA) 4 MG tablet Take 4 mg by mouth at bedtime.      Marland Kitchen FERREX 150 150 MG capsule Take 150 mg by mouth daily.  5  . furosemide (LASIX) 80 MG tablet TAKE 1 TABLET BY MOUTH DAILY. 30 tablet 0  . HUMALOG KWIKPEN 100 UNIT/ML KiwkPen Inject 2-15 units three times daily with meals  1  . insulin glargine (LANTUS) 100 UNIT/ML injection Inject 18-22 Units into the skin 2 (two) times daily. Take 22 units in the morning and 18 at night    . isosorbide mononitrate (IMDUR) 30 MG 24 hr tablet Take 1 tablet (30 mg  total) by mouth daily. NEEDS APPOINTMENT FOR FUTURE REFILLS 30 tablet 0  . losartan (COZAAR) 50 MG tablet Take 1 tablet (50 mg total) by mouth daily. FINAL WARNING NO ADDITIONAL REFILLS 7 tablet 0  . metoprolol tartrate (LOPRESSOR) 25 MG tablet Take 25 mg by mouth 2 (two) times daily.      Marland Kitchen NITROSTAT 0.4 MG SL tablet TAKE 1 TABLET BY MOUTH UNDER THE TONGUE EVERY 5MINUTES FOR 3 DOSES AS NEEDED FOR CHEST PAIN 25 tablet 1  . ONE TOUCH ULTRA TEST test strip     . simvastatin (ZOCOR) 20 MG tablet Take 1 tablet (20 mg total) by mouth daily at 6 PM. NEEDS APPOINTMENT FOR FUTURE REFILLS (Patient taking differently: Take 20 mg by mouth daily. NEEDS APPOINTMENT FOR FUTURE REFILLS) 30 tablet 0  . ULORIC 40 MG tablet Take 40 mg by mouth daily.  6  . venlafaxine XR (EFFEXOR-XR) 37.5 MG 24 hr capsule Take 75 mg by mouth daily.     No current facility-administered medications for this visit.       Social History   Social History  . Marital status: Married    Spouse name: N/A  . Number of children: 1A  . Years of education: N/A   Occupational History  . admin assist [employer]   Social History Main Topics  . Smoking status: Never Smoker  . Smokeless tobacco: Never  Used  . Alcohol use No  . Drug use: No  . Sexual activity: Yes   Other Topics Concern  . Not on file   Social History Narrative   ** Merged History Encounter **         Review of Systems: General: negative for chills, fever, night sweats or weight changes.  Cardiovascular: negative for chest pain, dyspnea on exertion, edema, orthopnea, palpitations, paroxysmal nocturnal dyspnea or shortness of breath Dermatological: negative for rash Respiratory: negative for cough or wheezing Urologic: negative for hematuria Abdominal: negative for nausea, vomiting, diarrhea, bright red blood per rectum, melena, or hematemesis Neurologic: negative for visual changes, syncope, or dizziness All other systems reviewed and are  otherwise negative except as noted above.    Blood pressure (!) 181/72, pulse 71, height 5\' 1"  (1.549 m), weight 197 lb 9.6 oz (89.6 kg).  General appearance: alert and no distress Neck: no adenopathy, no carotid bruit, no JVD, supple, symmetrical, trachea midline and thyroid not enlarged, symmetric, no tenderness/mass/nodules Lungs: clear to auscultation bilaterally Heart: regular rate and rhythm, S1, S2 normal, no murmur, click, rub or gallop Extremities: extremities normal, atraumatic, no cyanosis or edema  EKG sinus rhythm at 71 with septal Q waves. I personally reviewed this EKG  ASSESSMENT AND PLAN:   NSTEMI (non-ST elevated myocardial infarction) History of multiple cardiac catheterizations beginning in 2000 and again 2008 and finally 03/20/12. She denies chest pain or shortness of breath.  HTN (hypertension) History of hypertension blood pressure measured at 181/72. She is on doxazosin, losartan and metoprolol however unfortunately she has run out of her blood pressure medicines. We will refill her prescription.  Hyperlipidemia History of hyperlipidemia on statin therapy with lipid profile performed 07/07/15 revealing a total cholesterol 110, LDL 59 and HDL 29. This followed by her PCP  Acute on chronic diastolic congestive heart failure ([place]) History of diastolic heart failure and passed on Lasix. She's been fairly symptomatic from this point of view.      [name] FACP,FACC,FAHA, [facility] 09/30/2016 3:33 PM

## 2016-09-30 NOTE — Assessment & Plan Note (Signed)
History of diastolic heart failure and passed on Lasix. She's been fairly symptomatic from this point of view.

## 2016-11-21 IMAGING — CT CT CHEST W/O CM
1 series · 15 of 33 positions shown, 19 images · non-contrast
Comparison: 07/08/2015

CLINICAL DATA: Pulmonary nodule, followup

EXAM:
CT CHEST WITHOUT CONTRAST
TECHNIQUE: Multidetector CT imaging of the chest was performed following the
standard protocol without IV contrast. Sagittal and coronal MPR
images reconstructed from axial data set.

[Series 2: chest w/(date) · axial · 0.72mm/px · z∈[-319,-69]mm · 15 of 60 slices shown, 19 images]
[im 5/60  mediastinal]
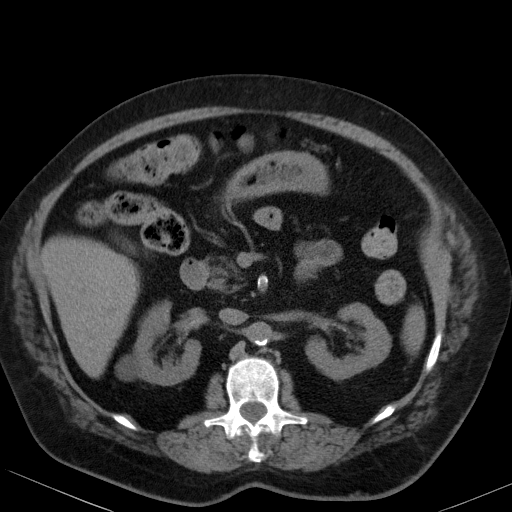
[im 5/60  lung]
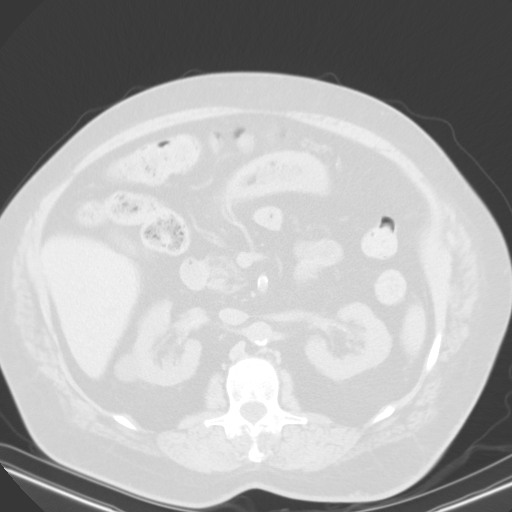
[im 9/60  lung]
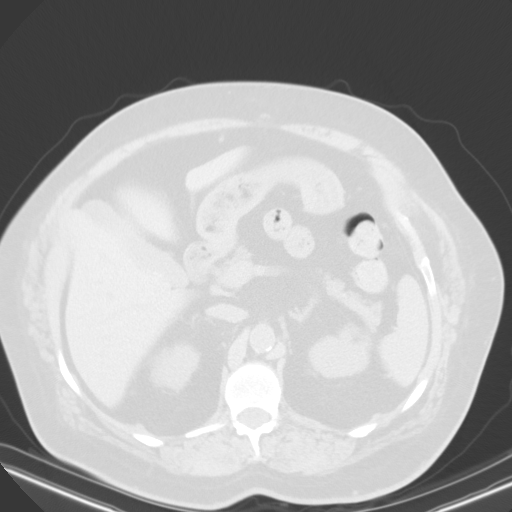
[im 12/60  lung]
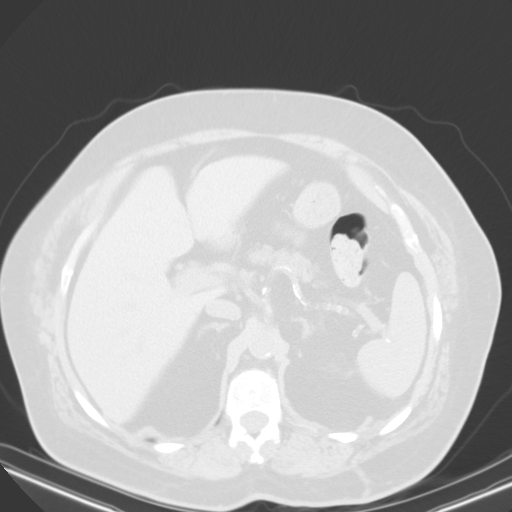
[im 16/60  lung]
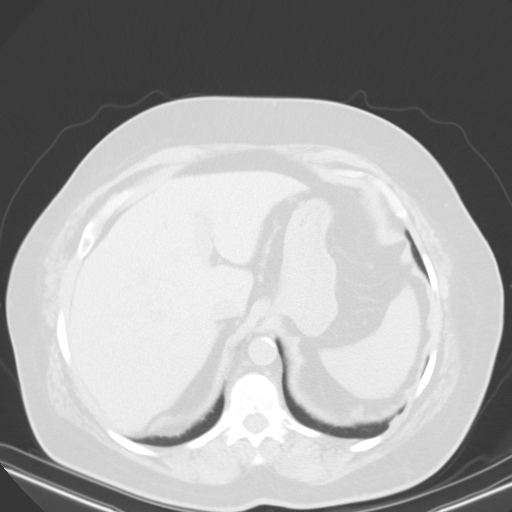
[im 20/60  mediastinal]
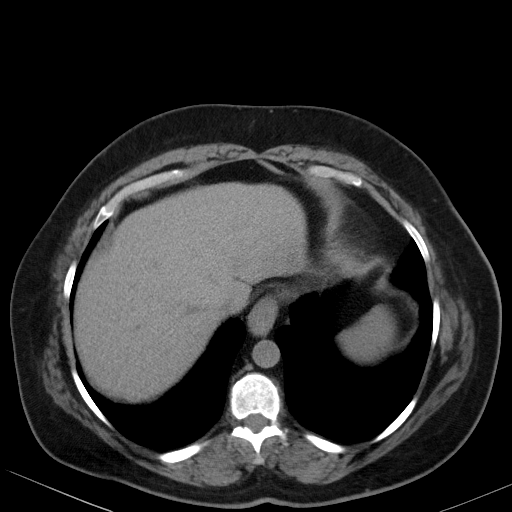
[im 20/60  lung]
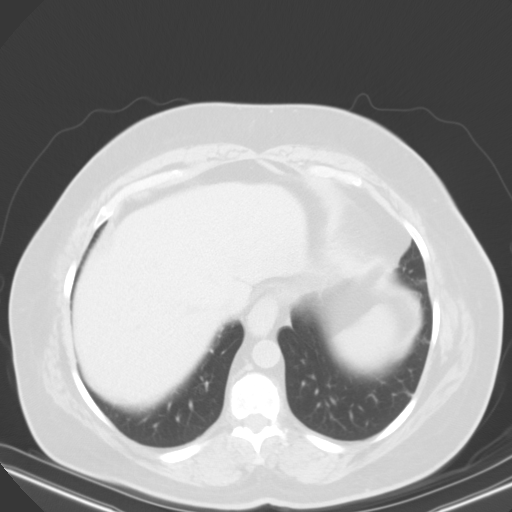
[im 24/60  lung]
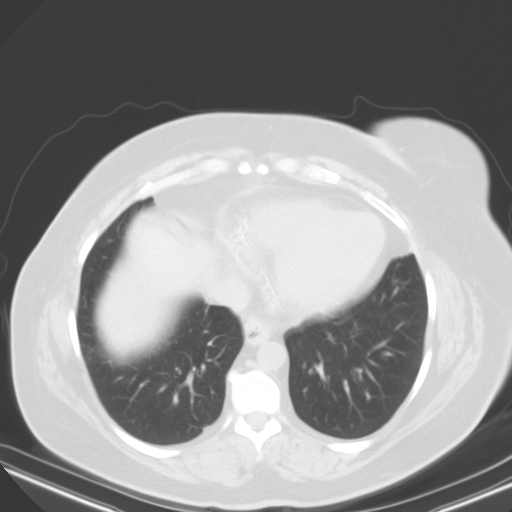
[im 27/60  lung]
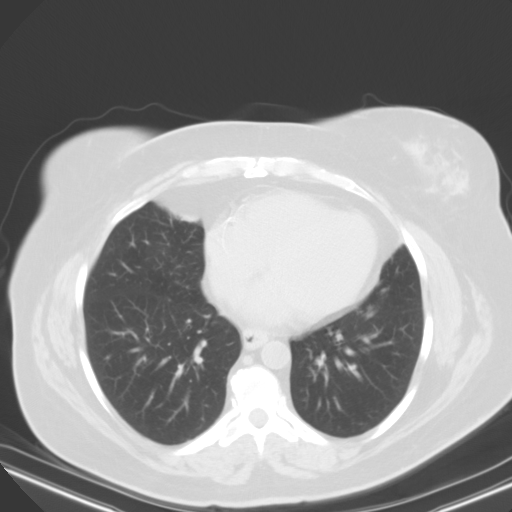
[im 31/60  lung]
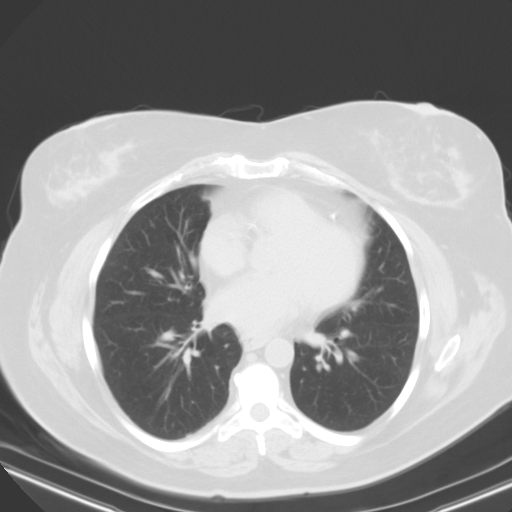
[im 33/60  mediastinal]
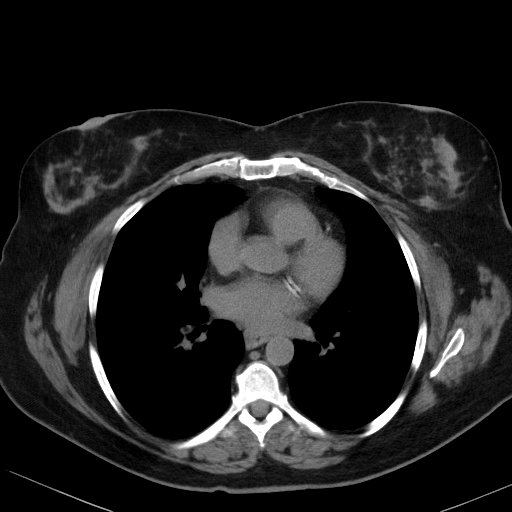
[im 33/60  lung]
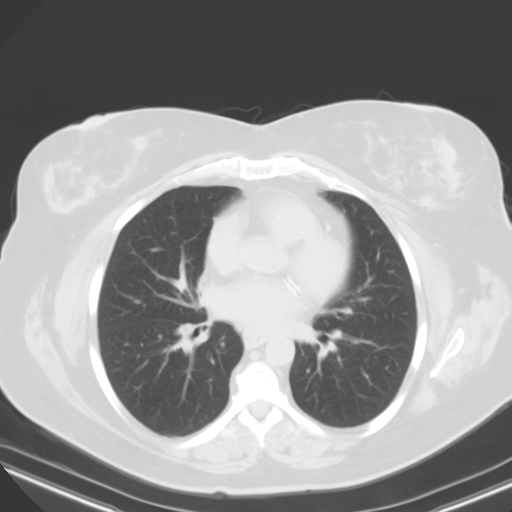
[im 36/60  lung]
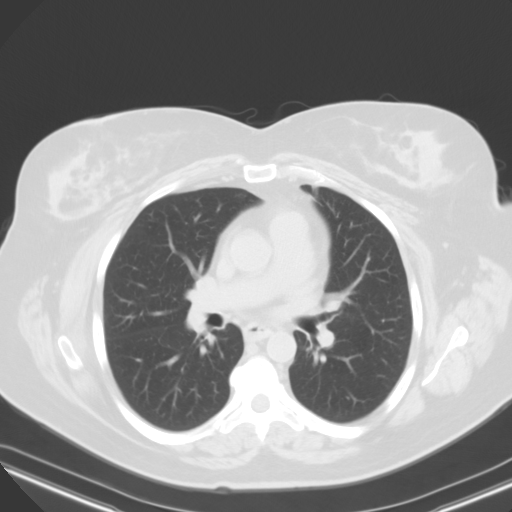
[im 40/60  lung]
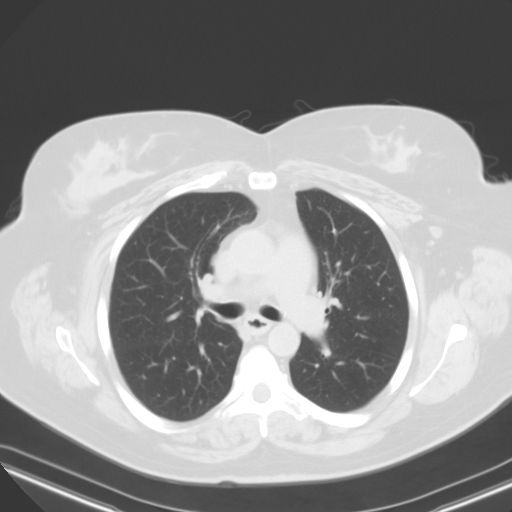
[im 44/60  lung]
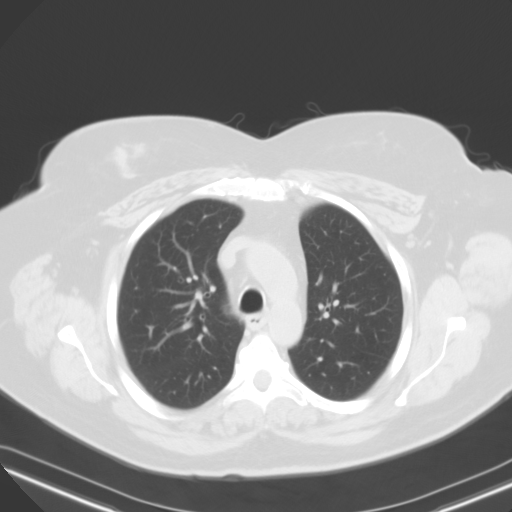
[im 48/60  mediastinal]
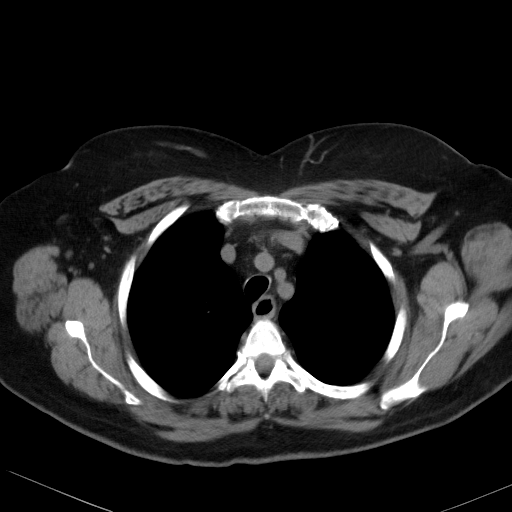
[im 48/60  lung]
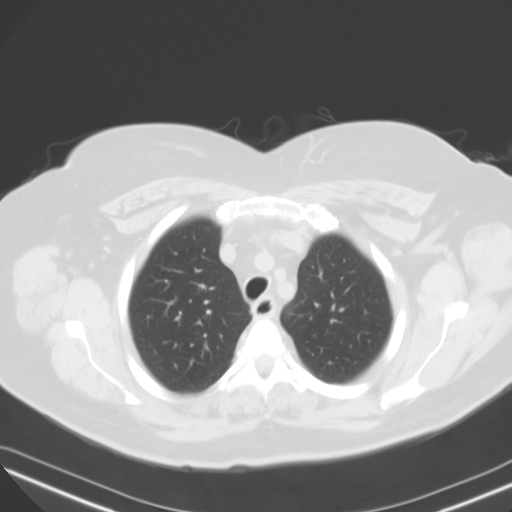
[im 51/60  lung]
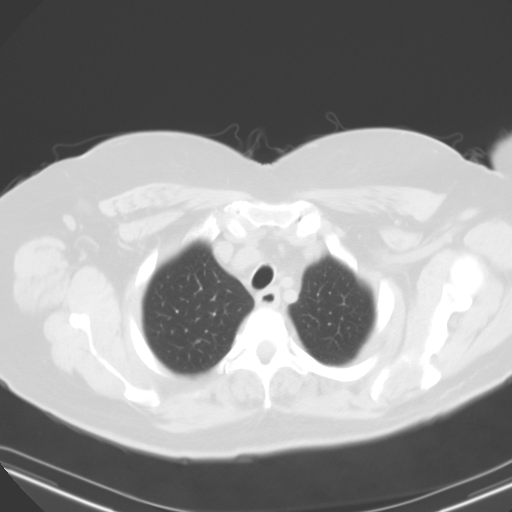
[im 55/60  lung]
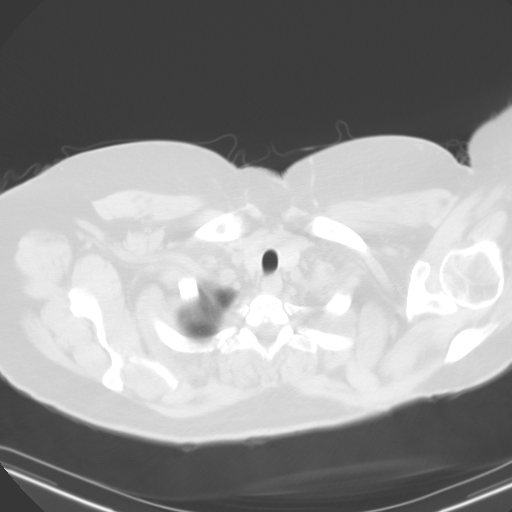

[15 of 33 positions shown; findings below may reference images not displayed]

FINDINGS: Atherosclerotic calcifications of coronary arteries, minimally in
aorta.

No thoracic adenopathy.

Dependent calcified gallstones in gallbladder.

Small exophytic cyst RIGHT kidney 20 x 19 mm image 57.

Remaining visualized portions of upper abdomen unremarkable.

3 mm subpleural nodule LEFT lower lobe image 42, area previously
obscured by atelectasis and/or infiltrate.

5 mm nodular density LEFT lower lobe image 23 questionably present
on previous exam.

Remaining lungs clear.

Resolution of previously identified RIGHT middle lobe ground-glass
opacity.

No new infiltrate, pleural effusion, pneumothorax, or mass/nodule.

Mild central peribronchial thickening.

No acute osseous findings.
IMPRESSION: Bronchitic changes with resolution of previously identified
ground-glass opacity RIGHT middle lobe.

Resolved bibasilar pleural effusions atelectasis/ consolidation.

Two LEFT lower lobe pulmonary nodules are identified, one of which
is likely present on the previous exam, second of which may have
been obscured by prior pulmonary opacities.

Cholelithiasis.

Coronary artery disease.

## 2017-07-07 ENCOUNTER — Other Ambulatory Visit: Payer: Self-pay | Admitting: *Deleted

## 2017-07-07 MED ORDER — FUROSEMIDE 80 MG PO TABS: 80.0000 mg | ORAL_TABLET | Freq: Every day | ORAL | 2 refills | Status: DC

## 2017-10-07 ENCOUNTER — Other Ambulatory Visit: Payer: Self-pay | Admitting: Cardiovascular Disease

## 2017-10-07 NOTE — Telephone Encounter (Signed)
REFILL 

## 2017-10-08 ENCOUNTER — Other Ambulatory Visit: Payer: Self-pay | Admitting: Cardiovascular Disease

## 2017-10-08 NOTE — Telephone Encounter (Signed)
REFILL 

## 2017-10-12 ENCOUNTER — Encounter: Payer: Self-pay | Admitting: Cardiovascular Disease

## 2017-10-12 ENCOUNTER — Ambulatory Visit: Payer: Commercial Managed Care - PPO | Admitting: Cardiovascular Disease

## 2017-10-12 DIAGNOSIS — I5033 Acute on chronic diastolic (congestive) heart failure: Secondary | ICD-10-CM

## 2017-10-12 DIAGNOSIS — I10 Essential (primary) hypertension: Secondary | ICD-10-CM

## 2017-10-12 DIAGNOSIS — E7800 Pure hypercholesterolemia, unspecified: Secondary | ICD-10-CM | POA: Diagnosis not present

## 2017-10-12 NOTE — Addendum Note (Signed)
Addended by: Zebedee Iba on: 10/12/2017 04:11 PM   Modules accepted: Orders

## 2017-10-12 NOTE — Assessment & Plan Note (Signed)
History of diastolic heart failure hospitalization and 2016 requiring Diuresis. Since that time she has had no further episodes although she does have some dependent edema at the end of the day.

## 2017-10-12 NOTE — Progress Notes (Signed)
10/12/2017 Courtney Pittman   1951/02/08  [id number]  Primary Physician Reynold Bowen, MD Primary Cardiologist: [name] Lupe Carney, [place]  HPI:  Courtney Pittman is a 67 y.o.  moderately overweight married Caucasian female mother of 69, grandmother to 1 grandchild who I last saw in the office  09/30/16.Marland Kitchen She has a history of noncritical CAD by cath in 2000, 2008, and again last year performed radially by Dr. Glenetta Hew March 20, 2012. Her other problems include obstructive sleep apnea intolerant to CPAP and was on C Pap Remotely which she stopped because of mouth dryness., hypertension, hyperlipidemia and insulin-dependent diabetes. She really denies chest pain but has had some back pain which I suspect is noncardiac. Her most recent lipid profile performed 07/09/15 revealed total cholesterol 110, LDL of 59 and HDL 29. She was recently admitted to [facility] on 27/03/50 with diastolic heart failure. She was diuresed and discharged to home today's later. Her torsemide was changed to furosemide 80 mg a day. She feels clinically improved. Since I saw her a year and a half ago she's remained clinically stable     Current Meds  Medication Sig  . aspirin 81 MG tablet Take 81 mg by mouth daily.    . Cholecalciferol (VITAMIN D PO) Take 1 tablet by mouth daily.  . Cinnamon 500 MG capsule Take 1,000 mg by mouth daily.  Marland Kitchen doxazosin (CARDURA) 4 MG tablet Take 4 mg by mouth at bedtime.    Marland Kitchen FERREX 150 150 MG capsule Take 150 mg by mouth daily.  . furosemide (LASIX) 80 MG tablet TAKE 1 TABLET BY MOUTH EVERY DAY  . HUMALOG KWIKPEN 100 UNIT/ML KiwkPen Inject 2-15 units three times daily with meals  . insulin glargine (LANTUS) 100 UNIT/ML injection Inject 18-22 Units into the skin 2 (two) times daily. Take 22 units in the morning and 18 at night  . isosorbide mononitrate (IMDUR) 30 MG 24 hr tablet TAKE 1 TABLET BY MOUTH DAILY.  Marland Kitchen losartan (COZAAR) 100 MG tablet Take 100 mg by  mouth daily.  . metoprolol tartrate (LOPRESSOR) 25 MG tablet Take 1 tablet (25 mg total) by mouth 2 (two) times daily. KEEP OV.  . nitroGLYCERIN (NITROSTAT) 0.4 MG SL tablet Place 1 tablet (0.4 mg total) under the tongue every 5 (five) minutes as needed for chest pain.  . ONE TOUCH ULTRA TEST test strip   . simvastatin (ZOCOR) 20 MG tablet Take 1 tablet (20 mg total) by mouth daily. KEEP OV.  Marland Kitchen ULORIC 40 MG tablet Take 40 mg by mouth daily.  Marland Kitchen venlafaxine XR (EFFEXOR-XR) 37.5 MG 24 hr capsule Take 75 mg by mouth daily.       Social History   Socioeconomic History  . Marital status: Married    Spouse name: Not on file  . Number of children: 1A  . Years of education: Not on file  . Highest education level: Not on file  Social Needs  . Financial resource strain: Not on file  . Food insecurity - worry: Not on file  . Food insecurity - inability: Not on file  . Transportation needs - medical: Not on file  . Transportation needs - non-medical: Not on file  Occupational History  . Occupation: Charity fundraiser: [employer]  Tobacco Use  . Smoking status: Never Smoker  . Smokeless tobacco: Never Used  Substance and Sexual Activity  . Alcohol use: No  . Drug  use: No  . Sexual activity: Yes  Other Topics Concern  . Not on file  Social History Narrative   ** Merged History Encounter **         Review of Systems: General: negative for chills, fever, night sweats or weight changes.  Cardiovascular: negative for chest pain, dyspnea on exertion, edema, orthopnea, palpitations, paroxysmal nocturnal dyspnea or shortness of breath Dermatological: negative for rash Respiratory: negative for cough or wheezing Urologic: negative for hematuria Abdominal: negative for nausea, vomiting, diarrhea, bright red blood per rectum, melena, or hematemesis Neurologic: negative for visual changes, syncope, or dizziness All other systems reviewed and are otherwise negative except as  noted above.    Blood pressure (!) 148/60, pulse 70, height 5\' 1"  (1.549 m), weight 203 lb 9.6 oz (92.4 kg).  General appearance: alert and no distress Neck: no adenopathy, no carotid bruit, no JVD, supple, symmetrical, trachea midline and thyroid not enlarged, symmetric, no tenderness/mass/nodules Lungs: clear to auscultation bilaterally Heart: regular rate and rhythm, S1, S2 normal, no murmur, click, rub or gallop Extremities: extremities normal, atraumatic, no cyanosis or edema Pulses: 2+ and symmetric Skin: Skin color, texture, turgor normal. No rashes or lesions Neurologic: Alert and oriented X 3, normal strength and tone. Normal symmetric reflexes. Normal coordination and gait  EKG sinus rhythm at 70 without ST or T-wave changes. I personally reviewed this EKG  ASSESSMENT AND PLAN:   HTN (hypertension) History of essential hypertension with blood pressure 140/60. She is on metoprolol, losartan and Cardura. Continue current meds at current dosing.  Hyperlipidemia History of hyperlipidemia on statin therapy followed by her PCP  Acute on chronic diastolic congestive heart failure ([place]) History of diastolic heart failure hospitalization and 2016 requiring Diuresis. Since that time she has had no further episodes although she does have some dependent edema at the end of the day.      [name] FACP,FACC,FAHA, [facility] 10/12/2017 3:53 PM

## 2017-10-12 NOTE — Assessment & Plan Note (Signed)
History of essential hypertension with blood pressure 140/60. She is on metoprolol, losartan and Cardura. Continue current meds at current dosing.

## 2017-10-12 NOTE — Assessment & Plan Note (Signed)
History of hyperlipidemia on statin therapy followed by her PCP. 

## 2017-10-12 NOTE — Patient Instructions (Signed)

## 2017-11-13 ENCOUNTER — Other Ambulatory Visit: Payer: Self-pay | Admitting: Cardiovascular Disease

## 2017-11-21 ENCOUNTER — Other Ambulatory Visit: Payer: Self-pay | Admitting: Cardiovascular Disease

## 2017-11-22 NOTE — Telephone Encounter (Signed)
Rx request sent to pharmacy.  

## 2017-12-06 ENCOUNTER — Other Ambulatory Visit: Payer: Self-pay | Admitting: Cardiovascular Disease

## 2017-12-07 NOTE — Telephone Encounter (Signed)
Rx sent to pharmacy   

## 2018-05-12 ENCOUNTER — Other Ambulatory Visit: Payer: Self-pay | Admitting: Cardiovascular Disease

## 2018-09-04 ENCOUNTER — Other Ambulatory Visit: Payer: Self-pay | Admitting: Cardiovascular Disease

## 2018-09-05 NOTE — Telephone Encounter (Signed)
Rx has been sent to the pharmacy electronically. ° °

## 2018-11-16 ENCOUNTER — Other Ambulatory Visit: Payer: Self-pay | Admitting: Cardiovascular Disease

## 2018-11-16 NOTE — Telephone Encounter (Signed)
Simvastatin and lopressor refilled.

## 2019-01-05 ENCOUNTER — Other Ambulatory Visit: Payer: Self-pay | Admitting: Cardiovascular Disease

## 2019-03-11 ENCOUNTER — Other Ambulatory Visit: Payer: Self-pay | Admitting: Cardiovascular Disease

## 2019-04-11 ENCOUNTER — Encounter: Payer: Self-pay | Admitting: Cardiology

## 2019-04-11 ENCOUNTER — Other Ambulatory Visit: Payer: Self-pay

## 2019-04-11 ENCOUNTER — Ambulatory Visit: Payer: Commercial Managed Care - PPO | Admitting: Cardiology

## 2019-04-11 VITALS — BP 142/64 | HR 61 | Ht 61.0 in | Wt 201.4 lb

## 2019-04-11 DIAGNOSIS — I252 Old myocardial infarction: Secondary | ICD-10-CM

## 2019-04-11 DIAGNOSIS — N183 Chronic kidney disease, stage 3 unspecified: Secondary | ICD-10-CM

## 2019-04-11 DIAGNOSIS — I5033 Acute on chronic diastolic (congestive) heart failure: Secondary | ICD-10-CM | POA: Diagnosis not present

## 2019-04-11 DIAGNOSIS — E785 Hyperlipidemia, unspecified: Secondary | ICD-10-CM

## 2019-04-11 DIAGNOSIS — I2510 Atherosclerotic heart disease of native coronary artery without angina pectoris: Secondary | ICD-10-CM

## 2019-04-11 DIAGNOSIS — R0989 Other specified symptoms and signs involving the circulatory and respiratory systems: Secondary | ICD-10-CM

## 2019-04-11 DIAGNOSIS — R079 Chest pain, unspecified: Secondary | ICD-10-CM

## 2019-04-11 DIAGNOSIS — I10 Essential (primary) hypertension: Secondary | ICD-10-CM

## 2019-04-11 NOTE — Assessment & Plan Note (Signed)
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

## 2019-04-11 NOTE — Progress Notes (Signed)
Cardiology Office Note:    Date:  04/11/2019   ID:  [name], DOB 1950-12-02, MRN [id number]  PCP:  Courtney Bowen, MD  Cardiologist:  Courtney Pittman Electrophysiologist:  None   Referring MD: Courtney Bowen, MD   Chief Complaint  Patient presents with  . Follow-up  Hospitalized at [facility] 03/29/2019  History of Present Illness:    Courtney Pittman is a 68 y.o. female from [place] with a hx of prior noncritical coronary disease in 2000 and 2008.  In 2013 she presented with an NSTEMI.  Catheterization at that time revealed moderate blockages in the major coronaries with a 40% LAD, 50% circumflex, 50% RCA, and severe disease in some branches including a 70 to 80% second diagonal branch, occluded OM1, and occluded RV branch.  These were not amenable to PCI and medical therapy was recommended.  She has done well on medical therapy, her last office visit with Courtney. Gwenlyn Pittman was February 2019.  Other medical issues include insulin-dependent diabetes followed by Courtney. Forde Pittman, stage III chronic renal insufficiency, hypertension with a history of chronic diastolic heart failure, obesity with a BMI of 38, and sleep apnea, intolerant to CPAP.  The patient presented to [facility] 03/29/2019 with complaints of left shoulder pain.  She tells me she had been having discomfort in her left shoulder for some weeks.  It would get worse when she was doing laundry or going up and down stairs.  She also noticed it after eating.  On the night of admission she says she just could not get comfortable.  She denies any associated nausea vomiting diaphoresis or shortness of breath.  In the emergency room her troponin was elevated at 0.06.  Her BNP was also elevated at 1000.  She was admitted for further evaluation.  Her medications were adjusted, Imdur was added and her beta-blocker was increased.  She was discharged and is in the office today for follow-up.  Since discharge her left shoulder pain is gotten better.  Her  blood pressure at home is acceptable although at times it does run a little high.  She did tell me that the left shoulder pain that brought her to the hospital on 812 was the same pain she had when she was admitted in 2013 with her NSTEMI.  Past Medical History:  Diagnosis Date  . Acute on chronic diastolic (congestive) heart failure ([place]) 07/08/2015  . Anemia   . Arthritis   . Colon polyps   . Coronary artery disease    nnoncritical by cardiac catheterization performed by Courtney. Ellyn Hack radially in 2013  . DM (diabetes mellitus), type II insulin dependent 03/20/2012  . Family history of premature coronary artery disease 03/20/2012  . GERD (gastroesophageal reflux disease) 03/20/2012  . History of left heart catheterization 03/2012  . HTN (hypertension) 03/20/2012  . Hx of tubal ligation 1978  . Hyperlipidemia 03/20/2012  . Joint pain   . Melanoma ([place]) 2014  . MI (myocardial infarction) ([place]) 20113   mild  . Obesity   . Sleep apnea with use of continuous positive airway pressure (CPAP) 2013    Past Surgical History:  Procedure Laterality Date  . CARDIAC CATHETERIZATION    . CESAREAN SECTION  1974  . EYE SURGERY     cataracts both eyes  . LEFT HEART CATHETERIZATION WITH CORONARY ANGIOGRAM N/A 03/21/2012   Procedure: LEFT HEART CATHETERIZATION WITH CORONARY ANGIOGRAM;  Surgeon: Courtney Man, MD;  Location: [facility] CATH LAB;  Service: Cardiovascular;  Laterality: N/A;  . MELANOMA EXCISION  2014   x 2 back  . TUBAL LIGATION  1974    Current Medications: Current Meds  Medication Sig  . aspirin 81 MG tablet Take 81 mg by mouth daily.    . Cholecalciferol (VITAMIN D PO) Take 1 tablet by mouth daily.  . Cinnamon 500 MG capsule Take 1,000 mg by mouth daily.  Marland Kitchen doxazosin (CARDURA) 4 MG tablet Take 4 mg by mouth at bedtime.    Marland Kitchen FERREX 150 150 MG capsule Take 150 mg by mouth daily.  . furosemide (LASIX) 80 MG tablet TAKE 1 TABLET BY MOUTH EVERY DAY  . HUMALOG KWIKPEN 100 UNIT/ML KiwkPen Inject  2-15 units three times daily with meals  . insulin glargine (LANTUS) 100 UNIT/ML injection Inject 18-22 Units into the skin 2 (two) times daily. Take 22 units in the morning and 18 at night  . isosorbide mononitrate (IMDUR) 60 MG 24 hr tablet Take 60 mg by mouth daily.  Marland Kitchen losartan (COZAAR) 100 MG tablet Take 100 mg by mouth daily.  . metoprolol tartrate (LOPRESSOR) 50 MG tablet Take 50 mg by mouth 2 (two) times daily.  . nitroGLYCERIN (NITROSTAT) 0.4 MG SL tablet Place 1 tablet (0.4 mg total) under the tongue every 5 (five) minutes as needed for chest pain.  . ONE TOUCH ULTRA TEST test strip   . simvastatin (ZOCOR) 20 MG tablet TAKE 1 TABLET (20 MG TOTAL) BY MOUTH DAILY. KEEP OV.  Marland Kitchen ULORIC 40 MG tablet Take 40 mg by mouth daily.  Marland Kitchen venlafaxine XR (EFFEXOR-XR) 37.5 MG 24 hr capsule Take 75 mg by mouth daily.     Allergies:   Penicillins   Social History   Socioeconomic History  . Marital status: Married    Spouse name: Not on file  . Number of children: 1A  . Years of education: Not on file  . Highest education level: Not on file  Occupational History  . Occupation: Charity fundraiser: [employer]  . Financial resource strain: Not on file  . Food insecurity    Worry: Not on file    Inability: Not on file  . Transportation needs    Medical: Not on file    Non-medical: Not on file  Tobacco Use  . Smoking status: Never Smoker  . Smokeless tobacco: Never Used  Substance and Sexual Activity  . Alcohol use: No  . Drug use: No  . Sexual activity: Yes  Lifestyle  . Physical activity    Days per week: Not on file    Minutes per session: Not on file  . Stress: Not on file  Relationships  . Social Herbalist on phone: Not on file    Gets together: Not on file    Attends religious service: Not on file    Active member of club or organization: Not on file    Attends meetings of clubs or organizations: Not on file    Relationship status: Not  on file  Other Topics Concern  . Not on file  Social History Narrative   ** Merged History Encounter **         Family History: The patient's family history includes Diabetes in an other family member; Heart attack in her father; Hypertension in her brother and sister.  ROS:   Please see the history of present illness.    Uncontrolled diabetes, BS was 500 on admission  All other  systems reviewed and are negative.  EKGs/Labs/Other Studies Reviewed:    The following studies were reviewed today: Cath 2013  EKG:  EKG is ordered today.  The ekg ordered today demonstrates NSR- HR 61, poor anterior RW  Recent Labs: No results Pittman for requested labs within last 8760 hours.  Recent Lipid Panel    Component Value Date/Time   CHOL 110 07/09/2015 0124   TRIG 111 07/09/2015 0124   HDL 29 (L) 07/09/2015 0124   CHOLHDL 3.8 07/09/2015 0124   VLDL 22 07/09/2015 0124   LDLCALC 59 07/09/2015 0124    Physical Exam:    VS:  BP (!) 142/64   Pulse 61   Ht 5\' 1"  (1.549 m)   Wt 201 lb 6.4 oz (91.4 kg)   BMI 38.05 kg/m     Wt Readings from Last 3 Encounters:  04/11/19 201 lb 6.4 oz (91.4 kg)  10/12/17 203 lb 9.6 oz (92.4 kg)  09/30/16 197 lb 9.6 oz (89.6 kg)     GEN: Obese Caucasian female well developed in no acute distress HEENT: Normal NECK: No JVD; bilateral carotid bruits LYMPHATICS: No lymphadenopathy CARDIAC: RRR, no murmurs, rubs, gallops RESPIRATORY:  Clear to auscultation without rales, wheezing or rhonchi  ABDOMEN: Soft, non-tender, non-distended MUSCULOSKELETAL:  No edema; No deformity  SKIN: Warm and dry NEUROLOGIC:  Alert and oriented x 3 PSYCHIATRIC:  Normal affect   ASSESSMENT:    Chest pain with moderate risk of acute coronary syndrome Admitted to [facility] 03/29/2019 with Lt shoulder pain, DCHF, and abnormal troponin  Acute on chronic diastolic heart failure ([place]) Admitted once in 2016 with acute on chronic diastolic heart failure. Echo Nov 2016- EF  60-65% with severe LVH, grade 1 DD Recent admission 03/29/2019 with [facility] and Lt shoulder pain concerning for [place]  Chronic kidney disease (CKD), stage III (moderate) (HCC) SCr 1.76 in 2016 SCr 1.73 03/29/2019  Bilateral carotid bruits Check dopplers  History of non-ST elevation myocardial infarction (NSTEMI) By EKG and enzymes at [facility] in 2013-and recently at [facility] hypertension Fair control  Dyslipidemia, goal LDL below 70 On statin Rx- will check lipids  Insulin dependent diabetes mellitus with complications (HCC) Followed by Courtney Courtney Pittman- renal complications  CAD (coronary artery disease) Minor CAD at cath '00 and '08. She was transferred to [facility] with a NSTEMI in 2013.  Cath revealed moderate disease in the major vessels- 40% LAD, 50% CFX, 40-50% RCA, with severe disease in the Dx2, and occluded OM1 and RV branch- not amenable to PCI- medical Rx recommended.   PLAN:    I ordered an Lexicographer.  If it's low risk I think I would recommend continued medical Rx.  If moderate to high we'll need to consider cath.  She has carotid bruits on exam, will check dopplers.    I ordered CMET, lipids, and an echo as well. F/U with Courtney Pittman 6-8 weeks or sooner if her studies indicate.    Medication Adjustments/Labs and Tests Ordered: Current medicines are reviewed at length with the patient today.  Concerns regarding medicines are outlined above.  Orders Placed This Encounter  Procedures  . MYOCARDIAL PERFUSION IMAGING  . EKG 12-Lead  . ECHOCARDIOGRAM COMPLETE  . VAS US CAROTID   No orders of the defined types were placed in this encounter.   Patient Instructions  Medication Instructions:  Your physician recommends that you continue on your current medications as directed. Please refer to the Current Medication list given to you  today. If you need a refill on your cardiac medications before your next appointment, please call your pharmacy.   Lab work: None   If you have labs (blood work) drawn today and your tests are completely normal, you will receive your results only by: Marland Kitchen MyChart Message (if you have MyChart) OR . A paper copy in the mail If you have any lab test that is abnormal or we need to change your treatment, we will call you to review the results.  Testing/Procedures: Your physician has requested that you have a lexiscan myoview. For further information please visit [email or web address]. Please follow instruction sheet, as given.  Your physician has requested that you have a carotid duplex. This test is an ultrasound of the carotid arteries in your neck. It looks at blood flow through these arteries that supply the brain with blood. Allow one hour for this exam. There are no restrictions or special instructions.  Your physician has requested that you have an echocardiogram. Echocardiography is a painless test that uses sound waves to create images of your heart. It provides your doctor with information about the size and shape of your heart and how well your heart's chambers and valves are working. This procedure takes approximately one hour. There are no restrictions for this procedure. [address]   Follow-Up: At [facility], you and your health needs are our priority.  As part of our continuing mission to provide you with exceptional heart care, we have created designated Provider Care Teams.  These Care Teams include your primary Cardiologist (physician) and Advanced Practice Providers (APPs -  Physician Assistants and Nurse Practitioners) who all work together to provide you with the care you need, when you need it. You will need a follow up appointment in 6-8 weeks.  Please call our office 2 months in advance to schedule this appointment.  You may see Courtney Quay Burow or one of the following Advanced Practice Providers on your designated Care Team:   Kerin Ransom, PA-C Roby Lofts, [place] . Sande Rives,  PA-C  Any Other Special Instructions Will Be Listed Below (If Applicable).      Courtney Form, PA-C  04/11/2019 11:51 AM    [facility]

## 2019-04-11 NOTE — Assessment & Plan Note (Signed)
Fair control.

## 2019-04-11 NOTE — Assessment & Plan Note (Signed)
On statin Rx- will check lipids

## 2019-04-11 NOTE — Assessment & Plan Note (Signed)
By EKG and enzymes at [facility] in 2013-and recently at [facility]

## 2019-04-11 NOTE — Assessment & Plan Note (Addendum)
Admitted once in 2016 with acute on chronic diastolic heart failure. Echo Nov 2016- EF 60-65% with severe LVH, grade 1 DD Recent admission 03/29/2019 with [facility] and Lt shoulder pain concerning for [place]

## 2019-04-11 NOTE — Assessment & Plan Note (Signed)
Admitted to [facility] 03/29/2019 with Lt shoulder pain, DCHF, and abnormal troponin

## 2019-04-11 NOTE — Assessment & Plan Note (Signed)
Minor CAD at cath '00 and '08. She was transferred to [facility] with a NSTEMI in 2013.  Cath revealed moderate disease in the major vessels- 40% LAD, 50% CFX, 40-50% RCA, with severe disease in the Dx2, and occluded OM1 and RV branch- not amenable to PCI- medical Rx recommended.

## 2019-04-11 NOTE — Assessment & Plan Note (Signed)
Check dopplers 

## 2019-04-11 NOTE — Patient Instructions (Addendum)
Medication Instructions:  Your physician recommends that you continue on your current medications as directed. Please refer to the Current Medication list given to you today. If you need a refill on your cardiac medications before your next appointment, please call your pharmacy.   Lab work: None  If you have labs (blood work) drawn today and your tests are completely normal, you will receive your results only by: Marland Kitchen MyChart Message (if you have MyChart) OR . A paper copy in the mail If you have any lab test that is abnormal or we need to change your treatment, we will call you to review the results.  Testing/Procedures: Your physician has requested that you have a lexiscan myoview. For further information please visit [email or web address]. Please follow instruction sheet, as given.  Your physician has requested that you have a carotid duplex. This test is an ultrasound of the carotid arteries in your neck. It looks at blood flow through these arteries that supply the brain with blood. Allow one hour for this exam. There are no restrictions or special instructions.  Your physician has requested that you have an echocardiogram. Echocardiography is a painless test that uses sound waves to create images of your heart. It provides your doctor with information about the size and shape of your heart and how well your heart's chambers and valves are working. This procedure takes approximately one hour. There are no restrictions for this procedure. [address]   Follow-Up: At [facility], you and your health needs are our priority.  As part of our continuing mission to provide you with exceptional heart care, we have created designated Provider Care Teams.  These Care Teams include your primary Cardiologist (physician) and Advanced Practice Providers (APPs -  Physician Assistants and Nurse Practitioners) who all work together to provide you with the care you need, when you need  it. You will need a follow up appointment in 6-8 weeks.  Please call our office 2 months in advance to schedule this appointment.  You may see Dr Quay Burow or one of the following Advanced Practice Providers on your designated Care Team:   Kerin Ransom, PA-C Roby Lofts, [place] . Sande Rives, PA-C  Any Other Special Instructions Will Be Listed Below (If Applicable).

## 2019-04-11 NOTE — Assessment & Plan Note (Signed)
SCr 1.76 in 2016 SCr 1.73 03/29/2019

## 2019-04-12 ENCOUNTER — Other Ambulatory Visit: Payer: Self-pay | Admitting: Cardiology

## 2019-04-12 DIAGNOSIS — R0989 Other specified symptoms and signs involving the circulatory and respiratory systems: Secondary | ICD-10-CM

## 2019-04-17 ENCOUNTER — Other Ambulatory Visit: Payer: Self-pay | Admitting: Endocrinology

## 2019-04-17 DIAGNOSIS — Z1231 Encounter for screening mammogram for malignant neoplasm of breast: Secondary | ICD-10-CM

## 2019-04-18 ENCOUNTER — Other Ambulatory Visit: Payer: Self-pay

## 2019-04-18 ENCOUNTER — Ambulatory Visit (HOSPITAL_COMMUNITY): Payer: Commercial Managed Care - PPO | Attending: Cardiovascular Disease

## 2019-04-18 DIAGNOSIS — I2510 Atherosclerotic heart disease of native coronary artery without angina pectoris: Secondary | ICD-10-CM | POA: Insufficient documentation

## 2019-04-18 DIAGNOSIS — R079 Chest pain, unspecified: Secondary | ICD-10-CM | POA: Insufficient documentation

## 2019-04-18 DIAGNOSIS — I5033 Acute on chronic diastolic (congestive) heart failure: Secondary | ICD-10-CM

## 2019-04-18 DIAGNOSIS — I252 Old myocardial infarction: Secondary | ICD-10-CM | POA: Diagnosis present

## 2019-04-18 DIAGNOSIS — N183 Chronic kidney disease, stage 3 (moderate): Secondary | ICD-10-CM | POA: Diagnosis not present

## 2019-04-21 ENCOUNTER — Telehealth (HOSPITAL_COMMUNITY): Payer: Self-pay

## 2019-04-21 NOTE — Telephone Encounter (Signed)
Encounter complete. 

## 2019-04-25 ENCOUNTER — Other Ambulatory Visit: Payer: Self-pay

## 2019-04-25 DIAGNOSIS — R5383 Other fatigue: Secondary | ICD-10-CM

## 2019-04-25 NOTE — Progress Notes (Signed)
Spoke with patient and she stated Dr Forde Dandy wanted to know if we could check her thyroid when she comes for labs. Tsh ordered.

## 2019-04-26 ENCOUNTER — Ambulatory Visit (HOSPITAL_BASED_OUTPATIENT_CLINIC_OR_DEPARTMENT_OTHER)
Admission: RE | Admit: 2019-04-26 | Discharge: 2019-04-26 | Disposition: A | Discharge status: Home / Self Care | Payer: Commercial Managed Care - PPO | Source: Ambulatory Visit | Attending: Cardiology | Admitting: Cardiology

## 2019-04-26 ENCOUNTER — Other Ambulatory Visit: Payer: Self-pay

## 2019-04-26 ENCOUNTER — Ambulatory Visit (HOSPITAL_COMMUNITY)
Admission: RE | Admit: 2019-04-26 | Discharge: 2019-04-26 | Disposition: A | Discharge status: Home / Self Care | Payer: Commercial Managed Care - PPO | Source: Ambulatory Visit | Attending: Cardiology | Admitting: Cardiology

## 2019-04-26 DIAGNOSIS — I5033 Acute on chronic diastolic (congestive) heart failure: Secondary | ICD-10-CM | POA: Insufficient documentation

## 2019-04-26 DIAGNOSIS — R079 Chest pain, unspecified: Secondary | ICD-10-CM | POA: Diagnosis present

## 2019-04-26 DIAGNOSIS — I252 Old myocardial infarction: Secondary | ICD-10-CM | POA: Diagnosis present

## 2019-04-26 DIAGNOSIS — N183 Chronic kidney disease, stage 3 (moderate): Secondary | ICD-10-CM | POA: Diagnosis not present

## 2019-04-26 DIAGNOSIS — I2510 Atherosclerotic heart disease of native coronary artery without angina pectoris: Secondary | ICD-10-CM

## 2019-04-26 DIAGNOSIS — R0989 Other specified symptoms and signs involving the circulatory and respiratory systems: Secondary | ICD-10-CM | POA: Insufficient documentation

## 2019-04-26 DIAGNOSIS — R5383 Other fatigue: Secondary | ICD-10-CM

## 2019-04-26 LAB — COMPREHENSIVE METABOLIC PANEL
ALT: 21 IU/L (ref 0–32)
AST: 19 IU/L (ref 0–40)
Albumin/Globulin Ratio: 1.7 (ref 1.2–2.2)
Albumin: 3.7 g/dL — ABNORMAL LOW (ref 3.8–4.8)
Alkaline Phosphatase: 50 IU/L (ref 39–117)
BUN/Creatinine Ratio: 29 — ABNORMAL HIGH (ref 12–28)
BUN: 52 mg/dL — ABNORMAL HIGH (ref 8–27)
Bilirubin Total: 0.4 mg/dL (ref 0.0–1.2)
CO2: 27 mmol/L (ref 20–29)
Calcium: 10.2 mg/dL (ref 8.7–10.3)
Chloride: 102 mmol/L (ref 96–106)
Creatinine, Ser: 1.78 mg/dL — ABNORMAL HIGH (ref 0.57–1.00)
GFR calc Af Amer: 34 mL/min/{1.73_m2} — ABNORMAL LOW (ref >59)
GFR calc non Af Amer: 29 mL/min/{1.73_m2} — ABNORMAL LOW (ref >59)
Globulin, Total: 2.2 g/dL (ref 1.5–4.5)
Glucose: 157 mg/dL — ABNORMAL HIGH (ref 65–99)
Potassium: 4.9 mmol/L (ref 3.5–5.2)
Sodium: 139 mmol/L (ref 134–144)
Total Protein: 5.9 g/dL — ABNORMAL LOW (ref 6.0–8.5)

## 2019-04-26 LAB — MYOCARDIAL PERFUSION IMAGING
LV dias vol: 103 mL (ref 46–106)
LV sys vol: 41 mL
Peak HR: 68 {beats}/min
Rest HR: 58 {beats}/min
SDS: 5
SRS: 6
SSS: 11
TID: 1.1

## 2019-04-26 LAB — LIPID PANEL
Chol/HDL Ratio: 3.6 ratio (ref 0.0–4.4)
Cholesterol, Total: 120 mg/dL (ref 100–199)
HDL: 33 mg/dL — ABNORMAL LOW (ref >39)
LDL Chol Calc (NIH): 72 mg/dL (ref 0–99)
Triglycerides: 76 mg/dL (ref 0–149)
VLDL Cholesterol Cal: 15 mg/dL (ref 5–40)

## 2019-04-26 LAB — TSH: TSH: 3.75 u[IU]/mL (ref 0.450–4.500)

## 2019-04-26 MED ORDER — TECHNETIUM TC 99M TETROFOSMIN IV KIT
30.8000 | PACK | Freq: Once | INTRAVENOUS | Status: AC | PRN
  Administered 2019-04-26: 30.8 via INTRAVENOUS
  Filled 2019-04-26: qty 31

## 2019-04-26 MED ORDER — REGADENOSON 0.4 MG/5ML IV SOLN
0.4000 mg | Freq: Once | INTRAVENOUS | Status: AC
  Administered 2019-04-26: 0.4 mg via INTRAVENOUS

## 2019-04-26 MED ORDER — TECHNETIUM TC 99M TETROFOSMIN IV KIT
10.6000 | PACK | Freq: Once | INTRAVENOUS | Status: AC | PRN
  Administered 2019-04-26: 10.6 via INTRAVENOUS
  Filled 2019-04-26: qty 11

## 2019-05-02 ENCOUNTER — Other Ambulatory Visit: Payer: Self-pay

## 2019-05-02 DIAGNOSIS — N183 Chronic kidney disease, stage 3 unspecified: Secondary | ICD-10-CM

## 2019-05-02 MED ORDER — FUROSEMIDE 80 MG PO TABS: ORAL_TABLET | ORAL | 3 refills | Status: DC

## 2019-05-25 ENCOUNTER — Other Ambulatory Visit: Payer: Self-pay | Admitting: Cardiovascular Disease

## 2019-05-27 ENCOUNTER — Other Ambulatory Visit: Payer: Self-pay | Admitting: Cardiovascular Disease

## 2019-06-05 ENCOUNTER — Other Ambulatory Visit: Payer: Self-pay | Admitting: Cardiovascular Disease

## 2019-06-06 ENCOUNTER — Other Ambulatory Visit: Payer: Self-pay | Admitting: Cardiovascular Disease

## 2019-06-06 ENCOUNTER — Ambulatory Visit: Payer: Commercial Managed Care - PPO | Admitting: Cardiovascular Disease

## 2019-06-06 ENCOUNTER — Other Ambulatory Visit: Payer: Self-pay

## 2019-06-06 ENCOUNTER — Encounter: Payer: Self-pay | Admitting: Cardiovascular Disease

## 2019-06-06 DIAGNOSIS — I2510 Atherosclerotic heart disease of native coronary artery without angina pectoris: Secondary | ICD-10-CM | POA: Diagnosis not present

## 2019-06-06 DIAGNOSIS — I10 Essential (primary) hypertension: Secondary | ICD-10-CM

## 2019-06-06 DIAGNOSIS — E785 Hyperlipidemia, unspecified: Secondary | ICD-10-CM | POA: Diagnosis not present

## 2019-06-06 MED ORDER — ISOSORBIDE MONONITRATE ER 60 MG PO TB24: 60.0000 mg | ORAL_TABLET | Freq: Every day | ORAL | 3 refills | Status: AC

## 2019-06-06 MED ORDER — FUROSEMIDE 80 MG PO TABS: 80.0000 mg | ORAL_TABLET | Freq: Every day | ORAL | 2 refills | Status: DC

## 2019-06-06 MED ORDER — FUROSEMIDE 80 MG PO TABS: 80.0000 mg | ORAL_TABLET | Freq: Every day | ORAL | 3 refills | Status: DC

## 2019-06-06 NOTE — Assessment & Plan Note (Signed)
History of essential hypertension blood pressure measured today 144/90.  She is on Cardura, losartan and metoprolol.

## 2019-06-06 NOTE — Assessment & Plan Note (Signed)
History of dyslipidemia on statin therapy with lipid profile performed 04/26/2019 revealing total cholesterol 120 and HDL 33.

## 2019-06-06 NOTE — Progress Notes (Signed)
06/06/2019 Courtney Pittman   Dec 14, 1950  [id number]  Primary Physician Reynold Bowen, MD Primary Cardiologist: [name] Lupe Carney, [place]  HPI:  Courtney Pittman is a 68 y.o.  moderately overweight married Caucasian female mother of 39, grandmother to 1 grandchild who I last saw in the office  10/12/2017.Marland Kitchen She has a history of noncritical CAD by cath in 2000, 2008, and again last year performed radially by Dr. Glenetta Hew March 20, 2012. Her other problems include obstructive sleep apnea intolerant to CPAP and was on C Pap Remotely which she stopped because of mouth dryness., hypertension, hyperlipidemia and insulin-dependent diabetes. She really denies chest pain but has had some back pain which I suspect is noncardiac. Her most recent lipid profile performed 07/09/15 revealed total cholesterol 110, LDL of 59 and HDL 29. She was recently admitted to [facility] on [id number] with diastolic heart failure. She was diuresed and discharged to home today's later. Her torsemide was changed to furosemide 80 mg a day. She feels clinically improved. She did see Kerin Ransom in the office 04/11/2019.  She again was complaining of some left shoulder pain and was seen in the emergency room for this.  She said this was similar to when she had a "small heart attack" in 2013.  A recent Myoview performed 04/26/2019 did show some mild ischemia in the RCA territory although this could also be an artifact.  The pain in her back occurs principally at night when she lies down and also after meals.   Current Meds  Medication Sig  . aspirin 81 MG tablet Take 81 mg by mouth daily.    . Cholecalciferol (VITAMIN D PO) Take 1 tablet by mouth daily.  . Cinnamon 500 MG capsule Take 1,000 mg by mouth daily.  Marland Kitchen doxazosin (CARDURA) 4 MG tablet Take 4 mg by mouth at bedtime.    Marland Kitchen FERREX 150 150 MG capsule Take 150 mg by mouth daily.  . furosemide (LASIX) 80 MG tablet TAKE 1 TABLET BY MOUTH EVERY DAY  .  HUMALOG KWIKPEN 100 UNIT/ML KiwkPen Inject 2-15 units three times daily with meals  . insulin glargine (LANTUS) 100 UNIT/ML injection Inject 18-22 Units into the skin 2 (two) times daily. Take 22 units in the morning and 18 at night  . isosorbide mononitrate (IMDUR) 60 MG 24 hr tablet Take 60 mg by mouth daily.  Marland Kitchen losartan (COZAAR) 100 MG tablet Take 100 mg by mouth daily.  . metoprolol tartrate (LOPRESSOR) 50 MG tablet Take 50 mg by mouth 2 (two) times daily.  . metoprolol tartrate (LOPRESSOR) 50 MG tablet Take 1 tablet (50 mg total) by mouth 2 (two) times daily.  . nitroGLYCERIN (NITROSTAT) 0.4 MG SL tablet Place 1 tablet (0.4 mg total) under the tongue every 5 (five) minutes as needed for chest pain.  . ONE TOUCH ULTRA TEST test strip   . simvastatin (ZOCOR) 20 MG tablet TAKE 1 TABLET BY MOUTH EVERY DAY  . ULORIC 40 MG tablet Take 40 mg by mouth daily.  Marland Kitchen venlafaxine XR (EFFEXOR-XR) 37.5 MG 24 hr capsule Take 75 mg by mouth daily.       Social History   Socioeconomic History  . Marital status: Married    Spouse name: Not on file  . Number of children: 1A  . Years of education: Not on file  . Highest education level: Not on file  Occupational History  . Occupation: admin assist  Employer: [employer]  . Financial resource strain: Not on file  . Food insecurity    Worry: Not on file    Inability: Not on file  . Transportation needs    Medical: Not on file    Non-medical: Not on file  Tobacco Use  . Smoking status: Never Smoker  . Smokeless tobacco: Never Used  Substance and Sexual Activity  . Alcohol use: No  . Drug use: No  . Sexual activity: Yes  Lifestyle  . Physical activity    Days per week: Not on file    Minutes per session: Not on file  . Stress: Not on file  Relationships  . Social Herbalist on phone: Not on file    Gets together: Not on file    Attends religious service: Not on file    Active member of club or  organization: Not on file    Attends meetings of clubs or organizations: Not on file    Relationship status: Not on file  . Intimate partner violence    Fear of current or ex partner: Not on file    Emotionally abused: Not on file    Physically abused: Not on file    Forced sexual activity: Not on file  Other Topics Concern  . Not on file  Social History Narrative   ** Merged History Encounter **         Review of Systems: General: negative for chills, fever, night sweats or weight changes.  Cardiovascular: negative for chest pain, dyspnea on exertion, edema, orthopnea, palpitations, paroxysmal nocturnal dyspnea or shortness of breath Dermatological: negative for rash Respiratory: negative for cough or wheezing Urologic: negative for hematuria Abdominal: negative for nausea, vomiting, diarrhea, bright red blood per rectum, melena, or hematemesis Neurologic: negative for visual changes, syncope, or dizziness All other systems reviewed and are otherwise negative except as noted above.    Blood pressure (!) 144/90, pulse 83, temperature (!) 97 F (36.1 C), height 5\' 3"  (1.6 m), weight 209 lb (94.8 kg).  General appearance: alert and no distress Neck: no adenopathy, no carotid bruit, no JVD, supple, symmetrical, trachea midline and thyroid not enlarged, symmetric, no tenderness/mass/nodules Lungs: clear to auscultation bilaterally Heart: regular rate and rhythm, S1, S2 normal, no murmur, click, rub or gallop Extremities: extremities normal, atraumatic, no cyanosis or edema Pulses: 2+ and symmetric Skin: Skin color, texture, turgor normal. No rashes or lesions Neurologic: Alert and oriented X 3, normal strength and tone. Normal symmetric reflexes. Normal coordination and gait  EKG not performed today  ASSESSMENT AND PLAN:   Essential hypertension History of essential hypertension blood pressure measured today 144/90.  She is on Cardura, losartan and metoprolol.  Dyslipidemia,  goal LDL below 70 History of dyslipidemia on statin therapy with lipid profile performed 04/26/2019 revealing total cholesterol 120 and HDL 33.  CAD (coronary artery disease) History of minimal CAD by cardiac cath in 2000 and again by myself 06/01/2007 for 40% stenosis in the LAD beyond the second septal perforator and a 30% proximal RCA stenosis.  Her pain is primarily in her back and shoulder blades.  She did have a Myoview stress test performed 04/26/2019 that showed some ischemia in the RCA territory.  I suspect this probably is artifact.  The pain actually occurs at night when she is lying down or after meals.      [name] FACP,FACC,FAHA, [facility] 06/06/2019 4:19 PM

## 2019-06-06 NOTE — Patient Instructions (Signed)

## 2019-06-06 NOTE — Assessment & Plan Note (Signed)
History of minimal CAD by cardiac cath in 2000 and again by myself 06/01/2007 for 40% stenosis in the LAD beyond the second septal perforator and a 30% proximal RCA stenosis.  Her pain is primarily in her back and shoulder blades.  She did have a Myoview stress test performed 04/26/2019 that showed some ischemia in the RCA territory.  I suspect this probably is artifact.  The pain actually occurs at night when she is lying down or after meals.

## 2019-06-16 ENCOUNTER — Other Ambulatory Visit (INDEPENDENT_AMBULATORY_CARE_PROVIDER_SITE_OTHER): Payer: Commercial Managed Care - PPO

## 2019-06-16 ENCOUNTER — Ambulatory Visit: Payer: Commercial Managed Care - PPO | Admitting: Gastroenterology

## 2019-06-16 ENCOUNTER — Encounter: Payer: Self-pay | Admitting: Gastroenterology

## 2019-06-16 VITALS — BP 128/62 | HR 71 | Temp 98.4°F | Ht 61.0 in | Wt 200.0 lb

## 2019-06-16 DIAGNOSIS — R1013 Epigastric pain: Secondary | ICD-10-CM | POA: Diagnosis not present

## 2019-06-16 DIAGNOSIS — Z794 Long term (current) use of insulin: Secondary | ICD-10-CM

## 2019-06-16 DIAGNOSIS — M25512 Pain in left shoulder: Secondary | ICD-10-CM | POA: Diagnosis not present

## 2019-06-16 DIAGNOSIS — E119 Type 2 diabetes mellitus without complications: Secondary | ICD-10-CM

## 2019-06-16 DIAGNOSIS — Z1159 Encounter for screening for other viral diseases: Secondary | ICD-10-CM | POA: Diagnosis not present

## 2019-06-16 DIAGNOSIS — Z86010 Personal history of colonic polyps: Secondary | ICD-10-CM | POA: Diagnosis not present

## 2019-06-16 LAB — CBC WITH DIFFERENTIAL/PLATELET
Basophils Absolute: 0.1 10*3/uL (ref 0.0–0.1)
Basophils Relative: 0.7 % (ref 0.0–3.0)
Eosinophils Absolute: 0.3 10*3/uL (ref 0.0–0.7)
Eosinophils Relative: 3.3 % (ref 0.0–5.0)
HCT: 30.6 % — ABNORMAL LOW (ref 36.0–46.0)
Hemoglobin: 10.5 g/dL — ABNORMAL LOW (ref 12.0–15.0)
Lymphocytes Relative: 19.2 % (ref 12.0–46.0)
Lymphs Abs: 1.8 10*3/uL (ref 0.7–4.0)
MCHC: 34.3 g/dL (ref 30.0–36.0)
MCV: 93.2 fl (ref 78.0–100.0)
Monocytes Absolute: 0.8 10*3/uL (ref 0.1–1.0)
Monocytes Relative: 8.7 % (ref 3.0–12.0)
Neutro Abs: 6.3 10*3/uL (ref 1.4–7.7)
Neutrophils Relative %: 68.1 % (ref 43.0–77.0)
Platelets: 327 10*3/uL (ref 150.0–400.0)
RBC: 3.28 Mil/uL — ABNORMAL LOW (ref 3.87–5.11)
RDW: 12.7 % (ref 11.5–15.5)
WBC: 9.3 10*3/uL (ref 4.0–10.5)

## 2019-06-16 MED ORDER — NA SULFATE-K SULFATE-MG SULF 17.5-3.13-1.6 GM/177ML PO SOLN: 1.0000 | Freq: Once | ORAL | 0 refills | Status: AC

## 2019-06-16 MED ORDER — METHOCARBAMOL 750 MG PO TABS: 750.0000 mg | ORAL_TABLET | Freq: Four times a day (QID) | ORAL | 0 refills | Status: AC | PRN

## 2019-06-16 NOTE — Progress Notes (Signed)
06/16/2019 Courtney Pittman [id number] 09/07/1950   HISTORY OF PRESENT ILLNESS: This is a pleasant 68 year old female who is known to [name] from a visit in 2015.  She is here today to discuss colonoscopy and also to discuss this pain that she gets in her left shoulder.  Her last colonoscopy was in 2012 by [name] at which time she did have an adenomatous polyp removed.  Repeat was recommended in 5 years.  She denies any change in bowel habits or rectal bleeding.  In regards to this left shoulder pain, she says that this has been present for 10 to 15 years.  Actually when she saw [name] in 2015 they discussed this as well.  She tells me that at this point this pain is occurring 5 to 7 days a week.  It is debilitating.  She says that most often it occurs after eating, but sometimes is not necessarily following any type of meal.  She says that she gets intense pain in the muscle of her left shoulder blade and she can physically feel a knot in that area.  Sometimes the pain gets so intense that she gets nausea and vomiting associated with it.  She is somewhat convinced that it is her gallbladder.  She denies any significant heartburn or reflux type symptoms and is not on any type of acid suppression.  She saw cardiology recently and they did cardiac evaluation do not feel that it is related to her heart.  The area where the knot occurs is actually over the area of a large scar from where she had a melanoma excised.   Past Medical History:  Diagnosis Date  . Acute on chronic diastolic (congestive) heart failure ([place]) 07/08/2015  . Anemia   . Arthritis   . Colon polyps   . Coronary artery disease    nnoncritical by cardiac catheterization performed by [name] radially in 2013  . DM (diabetes mellitus), type II insulin dependent 03/20/2012  . Family history of premature coronary artery disease 03/20/2012  . GERD (gastroesophageal reflux disease) 03/20/2012  . History of left heart  catheterization 03/2012  . HTN (hypertension) 03/20/2012  . Hx of tubal ligation 1978  . Hyperlipidemia 03/20/2012  . Joint pain   . Melanoma ([place]) 2014  . MI (myocardial infarction) ([place]) 20113   mild  . Obesity   . Sleep apnea with use of continuous positive airway pressure (CPAP) 2013   Past Surgical History:  Procedure Laterality Date  . CARDIAC CATHETERIZATION    . CESAREAN SECTION  1974  . EYE SURGERY     cataracts both eyes  . LEFT HEART CATHETERIZATION WITH CORONARY ANGIOGRAM N/A 03/21/2012   Procedure: LEFT HEART CATHETERIZATION WITH CORONARY ANGIOGRAM;  Surgeon: [name], MD;  Location: [facility] CATH LAB;  Service: Cardiovascular;  Laterality: N/A;  . MELANOMA EXCISION  2014   x 2 back  . TUBAL LIGATION  1974    reports that she has never smoked. She has never used smokeless tobacco. She reports that she does not drink alcohol or use drugs. family history includes Diabetes in an other family member; Heart attack in her father; Hypertension in her brother and sister. Allergies  Allergen Reactions  . Penicillins Swelling          Outpatient Encounter Medications as of 06/16/2019  Medication Sig  . aspirin 81 MG tablet Take 81 mg by mouth daily.    . Cholecalciferol (VITAMIN D PO) Take 1  tablet by mouth daily.  . Cinnamon 500 MG capsule Take 1,000 mg by mouth daily.  Marland Kitchen doxazosin (CARDURA) 4 MG tablet Take 4 mg by mouth at bedtime.    Marland Kitchen FERREX 150 150 MG capsule Take 150 mg by mouth daily.  . furosemide (LASIX) 80 MG tablet Take 1 tablet (80 mg total) by mouth daily.  Marland Kitchen HUMALOG KWIKPEN 100 UNIT/ML KiwkPen Inject 2-15 units three times daily with meals  . insulin glargine (LANTUS) 100 UNIT/ML injection Inject 18-22 Units into the skin 2 (two) times daily. Take 22 units in the morning and 18 at night  . isosorbide mononitrate (IMDUR) 60 MG 24 hr tablet Take 1 tablet (60 mg total) by mouth daily.  . metoprolol tartrate (LOPRESSOR) 50 MG tablet Take 50 mg by mouth 2 (two) times  daily.  . metoprolol tartrate (LOPRESSOR) 50 MG tablet Take 1 tablet (50 mg total) by mouth 2 (two) times daily.  . nitroGLYCERIN (NITROSTAT) 0.4 MG SL tablet Place 1 tablet (0.4 mg total) under the tongue every 5 (five) minutes as needed for chest pain.  Marland Kitchen olmesartan (BENICAR) 40 MG tablet Take 40 mg by mouth daily.  . ONE TOUCH ULTRA TEST test strip   . simvastatin (ZOCOR) 20 MG tablet TAKE 1 TABLET BY MOUTH EVERY DAY  . ULORIC 40 MG tablet Take 40 mg by mouth daily.  Marland Kitchen venlafaxine XR (EFFEXOR-XR) 37.5 MG 24 hr capsule Take 75 mg by mouth daily.  . [DISCONTINUED] losartan (COZAAR) 100 MG tablet Take 100 mg by mouth daily.   No facility-administered encounter medications on file as of 06/16/2019.      REVIEW OF SYSTEMS  : All other systems reviewed and negative except where noted in the History of Present Illness.   PHYSICAL EXAM: BP 128/62   Pulse 71   Temp 98.4 F (36.9 C)   Ht 5\' 1"  (1.549 m)   Wt 200 lb (90.7 kg)   BMI 37.79 kg/m  General: Well developed white female in no acute distress Head: Normocephalic and atraumatic Eyes:  Sclerae anicteric, conjunctiva pink. Ears: Normal auditory acuity Lungs: Clear throughout to auscultation; no increased WOB. Heart: Regular rate and rhythm; no M/R/G. Abdomen: Soft, non-distended.  BS present.  Mild epigastric TTP. Rectal:  Will be done at the time of colonoscopy. Musculoskeletal: Symmetrical with no gross deformities  Skin: No lesions on visible extremities Extremities: No edema  Neurological: Alert oriented x 4, grossly non-focal Psychological:  Alert and cooperative. Normal mood and affect  ASSESSMENT AND PLAN: *Personal history of colon polyps: Had adenomatous colon polyps in 2012.  Was due for recall in 2017.  Will schedule for colonoscopy with [name]. *Epigastric and left shoulder pain: This left shoulder pain has been present for 10 to 15 years.  Occurs at this point 5 to 7 days out of the week.  Commonly occurs after  eating.  She says that she physically gets a knot in the muscle of her shoulder.  This is associated with nausea and vomiting at times, but she thinks it may just be because the pain gets so severe that and induces the nausea and vomiting.  She denies any significant heartburn or reflux symptoms.  She think this could be gallbladder related.  I am not convinced that it is a primary GI problem at all necessarily, but she has clearly been debilitated by this.  I am going schedule her for an endoscopy with [name] as well and plan for an abdominal ultrasound.  I did give her a prescription for 15 Robaxin pills to take as a muscle relaxant to see if that helps.  She was advised that if it does help and nothing comes of her GI evaluation then she would need to continue this prescription or something similar with her PCP, however. *IDDM:  Insulin will be adjusted prior to endoscopic procedure per protocol. Will resume normal dosing after procedure.  **The risks, benefits, and alternatives to EGD and colonoscopy were discussed with the patient and she consents to proceed.   CC:  Reynold Bowen, MD

## 2019-06-16 NOTE — Patient Instructions (Signed)
If you are age 66 or older, your body mass index should be between 23-30. Your Body mass index is 37.79 kg/m. If this is out of the aforementioned range listed, please consider follow up with your Primary Care Provider.  If you are age 37 or younger, your body mass index should be between 19-25. Your Body mass index is 37.79 kg/m. If this is out of the aformentioned range listed, please consider follow up with your Primary Care Provider.    Your provider has requested that you go to the basement level for lab work before leaving today. Press "B" on the elevator. The lab is located at the first door on the left as you exit the elevator.    We have sent the following medications to your pharmacy for you to pick up at your convenience:  1. Robaxin 750 mg every 6 hours as needed.      You have been scheduled for an abdominal ultrasound at [facility] (1st floor of hospital) on 06/23/2019 at 9:30am. Please arrive 15 minutes prior to your appointment for registration. Make certain not to have anything to eat or drink 6 hours prior to your appointment. Should you need to reschedule your appointment, please contact radiology at [phone]. This test typically takes about 30 minutes to perform.  You have been scheduled for an endoscopy and colonoscopy. Please follow the written instructions given to you at your visit today. Please pick up your prep supplies at the pharmacy within the next 1-3 days. If you use inhalers (even only as needed), please bring them with you on the day of your procedure. Your physician has requested that you go to www.startemmi.com and enter the access code given to you at your visit today. This web site gives a general overview about your procedure. However, you should still follow specific instructions given to you by our office regarding your preparation for the procedure.

## 2019-06-18 NOTE — Progress Notes (Signed)
I agree with the above note, plan 

## 2019-06-23 ENCOUNTER — Other Ambulatory Visit: Payer: Self-pay

## 2019-06-23 ENCOUNTER — Ambulatory Visit (HOSPITAL_COMMUNITY)
Admission: RE | Admit: 2019-06-23 | Discharge: 2019-06-23 | Disposition: A | Discharge status: Home / Self Care | Payer: Commercial Managed Care - PPO | Source: Ambulatory Visit | Attending: Gastroenterology | Admitting: Gastroenterology

## 2019-06-23 DIAGNOSIS — R1013 Epigastric pain: Secondary | ICD-10-CM | POA: Insufficient documentation

## 2019-06-30 ENCOUNTER — Encounter: Payer: Self-pay | Admitting: Gastroenterology

## 2019-07-03 ENCOUNTER — Ambulatory Visit (INDEPENDENT_AMBULATORY_CARE_PROVIDER_SITE_OTHER): Payer: Commercial Managed Care - PPO

## 2019-07-03 ENCOUNTER — Other Ambulatory Visit: Payer: Self-pay | Admitting: Gastroenterology

## 2019-07-03 DIAGNOSIS — Z1159 Encounter for screening for other viral diseases: Secondary | ICD-10-CM

## 2019-07-03 LAB — SARS CORONAVIRUS 2 (TAT 6-24 HRS): SARS Coronavirus 2: NEGATIVE

## 2019-07-05 ENCOUNTER — Ambulatory Visit (AMBULATORY_SURGERY_CENTER): Payer: Commercial Managed Care - PPO | Admitting: Gastroenterology

## 2019-07-05 ENCOUNTER — Other Ambulatory Visit: Payer: Self-pay

## 2019-07-05 ENCOUNTER — Encounter: Payer: Self-pay | Admitting: Gastroenterology

## 2019-07-05 VITALS — BP 138/56 | HR 67 | Temp 98.0°F | Resp 15 | Ht 61.0 in | Wt 200.0 lb

## 2019-07-05 DIAGNOSIS — K2951 Unspecified chronic gastritis with bleeding: Secondary | ICD-10-CM | POA: Diagnosis not present

## 2019-07-05 DIAGNOSIS — K317 Polyp of stomach and duodenum: Secondary | ICD-10-CM | POA: Diagnosis not present

## 2019-07-05 DIAGNOSIS — R1013 Epigastric pain: Secondary | ICD-10-CM

## 2019-07-05 DIAGNOSIS — K2100 Gastro-esophageal reflux disease with esophagitis, without bleeding: Secondary | ICD-10-CM

## 2019-07-05 DIAGNOSIS — K2970 Gastritis, unspecified, without bleeding: Secondary | ICD-10-CM

## 2019-07-05 DIAGNOSIS — Z86010 Personal history of colonic polyps: Secondary | ICD-10-CM | POA: Diagnosis not present

## 2019-07-05 MED ORDER — OMEPRAZOLE 40 MG PO CPDR: 40.0000 mg | DELAYED_RELEASE_CAPSULE | Freq: Every day | ORAL | 11 refills | Status: AC

## 2019-07-05 MED ORDER — SODIUM CHLORIDE 0.9 % IV SOLN: 500.0000 mL | Freq: Once | INTRAVENOUS | Status: AC

## 2019-07-05 NOTE — Op Note (Signed)
[place] Patient Name: Courtney Pittman Procedure Date: 07/05/2019 2:14 PM MRN: [id number] Endoscopist: Milus Banister , MD Age: 68 Referring MD:  Date of Birth: 05/30/1951 Gender: Female Account #: [id number] Procedure:                Upper GI endoscopy Indications:              Heartburn (rare), left shoulder pain Medicines:                Monitored Anesthesia Care Procedure:                Pre-Anesthesia Assessment:                           - Prior to the procedure, a History and Physical                            was performed, and patient medications and                            allergies were reviewed. The patient's tolerance of                            previous anesthesia was also reviewed. The risks                            and benefits of the procedure and the sedation                            options and risks were discussed with the patient.                            All questions were answered, and informed consent                            was obtained. Prior Anticoagulants: The patient has                            taken no previous anticoagulant or antiplatelet                            agents. ASA Grade Assessment: III - A patient with                            severe systemic disease. After reviewing the risks                            and benefits, the patient was deemed in                            satisfactory condition to undergo the procedure.                           After obtaining informed consent, the endoscope was  passed under direct vision. Throughout the                            procedure, the patient's blood pressure, pulse, and                            oxygen saturations were monitored continuously. The                            Endoscope was introduced through the mouth, and                            advanced to the second part of duodenum. The upper                            GI endoscopy was  accomplished without difficulty.                            The patient tolerated the procedure well. Scope In: Scope Out: Findings:                 LA Grade B reflux related distal esophagitis.                           Fleshy, 1cm nodule just distal to the GE junction.                            This appeared inflammatory and it was biopsied.                           Mild inflammation characterized by erosions and                            erythema was found in the gastric antrum. Biopsies                            were taken with a cold forceps for histology.                           The exam was otherwise without abnormality. Complications:            No immediate complications. Estimated blood loss:                            None. Estimated Blood Loss:     Estimated blood loss: none. Impression:               - Reflux related esophagitis.                           - 1cm nodule just distal to the GE junction. This                            did not appear neoplastic, was biopsied to be  certain.                           - Mild distal gastritis.                           - The examination was otherwise normal. Recommendation:           - Patient has a contact number available for                            emergencies. The signs and symptoms of potential                            delayed complications were discussed with the                            patient. Return to normal activities tomorrow.                            Written discharge instructions were provided to the                            patient.                           - Resume previous diet.                           - Continue present medications. Trial of omeprazole                            (new script called in today) 40mg  pills one pill                            20-30 min before breakfast daily, disp 30 with 11                            refills. This is to help heal the acid  related                            esophagus damage (esophagitis) and it MAY help your                            left shoulder pain as well if that is related to                            GERD.                           - Await pathology results. Milus Banister, MD 07/05/2019 2:55:46 PM This report has been signed electronically.

## 2019-07-05 NOTE — Progress Notes (Signed)
PT taken to PACU. Monitors in place. VSS. Report given to RN. 

## 2019-07-05 NOTE — Patient Instructions (Signed)
YOU HAD AN ENDOSCOPIC PROCEDURE TODAY AT [facility]:   Refer to the procedure report that was given to you for any specific questions about what was found during the examination.  If the procedure report does not answer your questions, please call your gastroenterologist to clarify.  If you requested that your care partner not be given the details of your procedure findings, then the procedure report has been included in a sealed envelope for you to review at your convenience later.  YOU SHOULD EXPECT: Some feelings of bloating in the abdomen. Passage of more gas than usual.  Walking can help get rid of the air that was put into your GI tract during the procedure and reduce the bloating. If you had a lower endoscopy (such as a colonoscopy or flexible sigmoidoscopy) you may notice spotting of blood in your stool or on the toilet paper. If you underwent a bowel prep for your procedure, you may not have a normal bowel movement for a few days.  Please Note:  You might notice some irritation and congestion in your nose or some drainage.  This is from the oxygen used during your procedure.  There is no need for concern and it should clear up in a day or so.  SYMPTOMS TO REPORT IMMEDIATELY:   Following lower endoscopy (colonoscopy or flexible sigmoidoscopy):  Excessive amounts of blood in the stool  Significant tenderness or worsening of abdominal pains  Swelling of the abdomen that is new, acute  Fever of 100F or higher   Following upper endoscopy (EGD)  Vomiting of blood or coffee ground material  New chest pain or pain under the shoulder blades  Painful or persistently difficult swallowing  New shortness of breath  Fever of 100F or higher  Black, tarry-looking stools  For urgent or emergent issues, a gastroenterologist can be reached at any hour by calling [phone].   DIET:  We do recommend a small meal at first, but then you may proceed to your regular diet.  Drink  plenty of fluids but you should avoid alcoholic beverages for 24 hours.  MEDICATIONS: Continue present medications. Trial of Omeprazole 40 mg by mouth, take one pill 20-30 minutes before breakfast daily. #30 with 11 refills sent to your pharmacy for you to pick up at your convenience. This medication is to  Help heal your acid related esophagus damage (esophagitis) and it MAY help your left shoulder pain as well if this pain is related to your gastric reflux disease.  Please see handouts given to you by your recovery nurse.  ACTIVITY:  You should plan to take it easy for the rest of today and you should NOT DRIVE or use heavy machinery until tomorrow (because of the sedation medicines used during the test).    FOLLOW UP: Our staff will call the number listed on your records 48-72 hours following your procedure to check on you and address any questions or concerns that you may have regarding the information given to you following your procedure. If we do not reach you, we will leave a message.  We will attempt to reach you two times.  During this call, we will ask if you have developed any symptoms of COVID 19. If you develop any symptoms (ie: fever, flu-like symptoms, shortness of breath, cough etc.) before then, please call [phone].  If you test positive for Covid 19 in the 2 weeks post procedure, please call and report this information to [place].  If any biopsies were taken you will be contacted by phone or by letter within the next 1-3 weeks.  Please call us at [phone] if you have not heard about the biopsies in 3 weeks.   Thank you for allowing [place] to provide for your healthcare needs today.   SIGNATURES/CONFIDENTIALITY: You and/or your care partner have signed paperwork which will be entered into your electronic medical record.  These signatures attest to the fact that that the information above on your After Visit Summary has been reviewed and is understood.  Full responsibility of  the confidentiality of this discharge information lies with you and/or your care-partner.

## 2019-07-05 NOTE — Progress Notes (Signed)
Called to room to assist during endoscopic procedure.  Patient ID and intended procedure confirmed with present staff. Received instructions for my participation in the procedure from the performing physician.  

## 2019-07-05 NOTE — Op Note (Signed)
[place] Patient Name: Courtney Pittman Procedure Date: 07/05/2019 2:15 PM MRN: [id number] Endoscopist: Milus Banister , MD Age: 68 Referring MD:  Date of Birth: 1950-09-30 Gender: Female Account #: [id number] Procedure:                Colonoscopy Indications:              High risk colon cancer surveillance: Personal                            history of colonic polyps; colonoscopy 2012 Dr.                            Collene Mares found a single subCM adenoma Medicines:                Monitored Anesthesia Care Procedure:                Pre-Anesthesia Assessment:                           - Prior to the procedure, a History and Physical                            was performed, and patient medications and                            allergies were reviewed. The patient's tolerance of                            previous anesthesia was also reviewed. The risks                            and benefits of the procedure and the sedation                            options and risks were discussed with the patient.                            All questions were answered, and informed consent                            was obtained. Prior Anticoagulants: The patient has                            taken no previous anticoagulant or antiplatelet                            agents. ASA Grade Assessment: III - A patient with                            severe systemic disease. After reviewing the risks                            and benefits, the patient was deemed in  satisfactory condition to undergo the procedure.                           After obtaining informed consent, the colonoscope                            was passed under direct vision. Throughout the                            procedure, the patient's blood pressure, pulse, and                            oxygen saturations were monitored continuously. The                            Colonoscope was introduced  through the anus and                            advanced to the the cecum, identified by                            appendiceal orifice and ileocecal valve. The                            colonoscopy was performed without difficulty. The                            patient tolerated the procedure well. The quality                            of the bowel preparation was good. The ileocecal                            valve, appendiceal orifice, and rectum were                            photographed. Scope In: 2:21:22 PM Scope Out: 2:33:17 PM Scope Withdrawal Time: 0 hours 8 minutes 12 seconds  Total Procedure Duration: 0 hours 11 minutes 55 seconds  Findings:                 The entire examined colon appeared normal on direct                            and retroflexion views. Complications:            No immediate complications. Estimated blood loss:                            None. Estimated Blood Loss:     Estimated blood loss: none. Impression:               - The entire examined colon is normal on direct and                            retroflexion views.                           -  No polyps or cancers. Recommendation:           - Patient has a contact number available for                            emergencies. The signs and symptoms of potential                            delayed complications were discussed with the                            patient. Return to normal activities tomorrow.                            Written discharge instructions were provided to the                            patient.                           - Resume previous diet.                           - Continue present medications.                           - Repeat colonoscopy in 10 years for screening. Milus Banister, MD 07/05/2019 2:37:26 PM This report has been signed electronically.

## 2019-07-07 ENCOUNTER — Telehealth: Payer: Self-pay

## 2019-07-07 NOTE — Telephone Encounter (Signed)
  Follow up Call-  Call back number 07/05/2019  Post procedure Call Back phone  # [phone]  Permission to leave phone message Yes  Some recent data might be hidden     Patient questions:  Do you have a fever, pain , or abdominal swelling? No. Pain Score  0 *  Have you tolerated food without any problems? Yes.    Have you been able to return to your normal activities? Yes.    Do you have any questions about your discharge instructions: Diet   No. Medications  No. Follow up visit  No.  Do you have questions or concerns about your Care? No.  Actions: * If pain score is 4 or above: No action needed, pain <4.

## 2019-07-15 ENCOUNTER — Encounter: Payer: Self-pay | Admitting: Gastroenterology

## 2019-08-30 ENCOUNTER — Other Ambulatory Visit: Payer: Self-pay | Admitting: Cardiovascular Disease

## 2019-09-01 ENCOUNTER — Telehealth: Payer: Self-pay | Admitting: Gastroenterology

## 2019-09-01 NOTE — Telephone Encounter (Signed)
The patient has been notified of this information and all questions answered.

## 2019-09-01 NOTE — Telephone Encounter (Signed)
Yes.  I am not sure if her pains are related to her gallstones however certainly sitting down with a surgeon to discuss this further is very reasonable.

## 2019-09-01 NOTE — Telephone Encounter (Signed)
Referral made to [facility] for the pt.

## 2019-09-01 NOTE — Telephone Encounter (Signed)
Pt would like a referral to a surgeon. She states that last time she had colonoscopy she was told that she had gallstones. She was prescribed a med for acid reflux and was told that if that did not alleviate her sxs then she was going to be referred to a surgeon to removed her gallbladder.

## 2019-09-01 NOTE — Telephone Encounter (Signed)
Dr Ardis Hughs is it ok to refer to [facility]?

## 2019-12-06 MED ORDER — METOPROLOL SUCCINATE ER 50 MG PO TB24
100.00 | ORAL_TABLET | ORAL | Status: DC
Start: 2019-12-06 — End: 2019-12-06

## 2019-12-06 MED ORDER — GLUCOSE 40 % PO GEL
15.00 | ORAL | Status: DC
Start: ? — End: 2019-12-06

## 2019-12-06 MED ORDER — ASPIRIN 81 MG PO TBEC
81.00 | DELAYED_RELEASE_TABLET | ORAL | Status: DC
Start: 2019-12-07 — End: 2019-12-06

## 2019-12-06 MED ORDER — DEXTROSE 10 % IV SOLN
125.00 | INTRAVENOUS | Status: DC
Start: ? — End: 2019-12-06

## 2019-12-06 MED ORDER — STRI-DEX MAXIMUM STRENGTH 2 % EX PADS
125.00 | MEDICATED_PAD | CUTANEOUS | Status: DC
Start: ? — End: 2019-12-06

## 2019-12-06 MED ORDER — ISOSORBIDE MONONITRATE ER 30 MG PO TB24
30.00 | ORAL_TABLET | ORAL | Status: DC
Start: 2019-12-07 — End: 2019-12-06

## 2019-12-06 MED ORDER — INSULIN LISPRO 100 UNIT/ML ~~LOC~~ SOLN
6.00 | SUBCUTANEOUS | Status: DC
Start: 2019-12-06 — End: 2019-12-06

## 2019-12-06 MED ORDER — ENOXAPARIN SODIUM 40 MG/0.4ML ~~LOC~~ SOLN
40.00 | SUBCUTANEOUS | Status: DC
Start: 2019-12-07 — End: 2019-12-06

## 2019-12-06 MED ORDER — PANTOPRAZOLE SODIUM 40 MG PO TBEC
40.00 | DELAYED_RELEASE_TABLET | ORAL | Status: DC
Start: 2019-12-07 — End: 2019-12-06

## 2019-12-06 MED ORDER — ATORVASTATIN CALCIUM 40 MG PO TABS
80.00 | ORAL_TABLET | ORAL | Status: DC
Start: 2019-12-06 — End: 2019-12-06

## 2019-12-06 MED ORDER — INSULIN LISPRO 100 UNIT/ML ~~LOC~~ SOLN
1.00 | SUBCUTANEOUS | Status: DC
Start: 2019-12-06 — End: 2019-12-06

## 2019-12-06 MED ORDER — PHENYLEPHRINE-GUAIFENESIN 30-400 MG PO CP12
10.00 | ORAL_CAPSULE | ORAL | Status: DC
Start: 2019-12-07 — End: 2019-12-06

## 2019-12-06 MED ORDER — GENERIC EXTERNAL MEDICATION
60.00 | Status: DC
Start: 2019-12-07 — End: 2019-12-06

## 2019-12-06 MED ORDER — VENLAFAXINE HCL 37.5 MG PO TABS
75.00 | ORAL_TABLET | ORAL | Status: DC
Start: 2019-12-07 — End: 2019-12-06

## 2019-12-06 MED ORDER — INSULIN GLARGINE 100 UNIT/ML ~~LOC~~ SOLN
10.00 | SUBCUTANEOUS | Status: DC
Start: 2019-12-06 — End: 2019-12-06

## 2019-12-06 MED ORDER — NUTREN 1.0 PO
1.00 | ORAL | Status: DC
Start: ? — End: 2019-12-06

## 2019-12-06 MED ORDER — CLOPIDOGREL BISULFATE 75 MG PO TABS
75.00 | ORAL_TABLET | ORAL | Status: DC
Start: 2019-12-07 — End: 2019-12-06

## 2019-12-06 MED ORDER — HYDRALAZINE HCL 25 MG PO TABS
25.00 | ORAL_TABLET | ORAL | Status: DC
Start: 2019-12-06 — End: 2019-12-06

## 2020-07-23 IMAGING — US US ABDOMEN COMPLETE
1 series · 13 of 25 positions shown · non-contrast
Comparison: None.

CLINICAL DATA: Epigastric pain several years.

EXAM:
ABDOMEN ULTRASOUND COMPLETE

[Series 1: us abdomen complete · 13 of 112 slices shown]
[im 1/112]
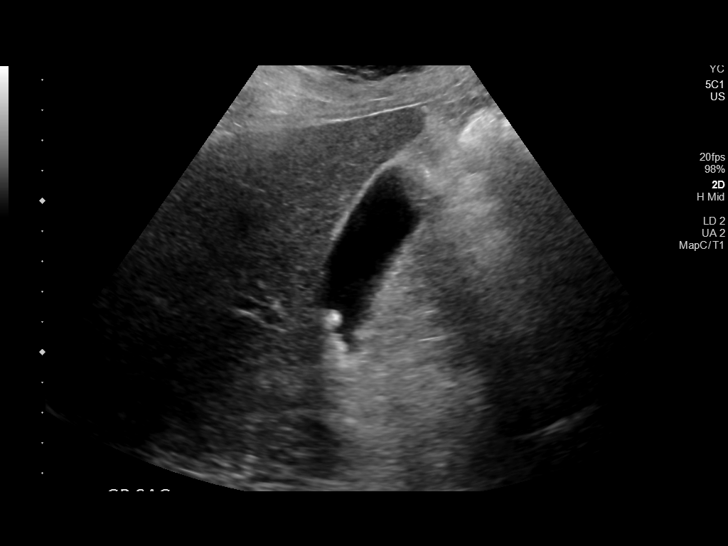
[im 10/112]
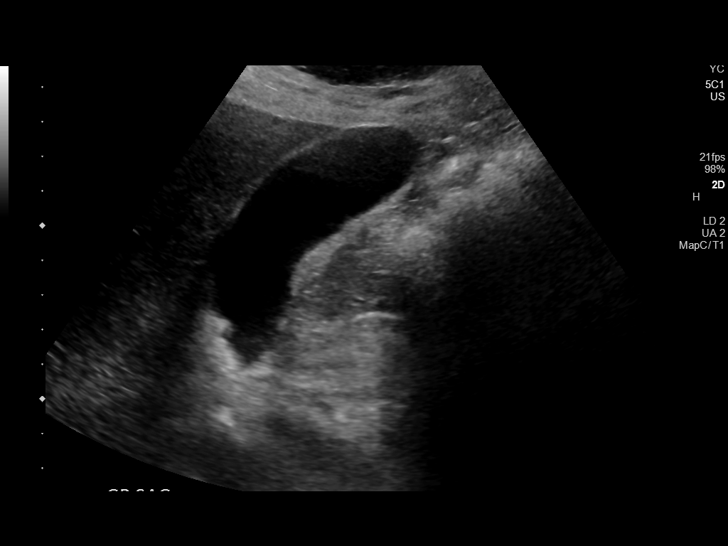
[im 19/112]
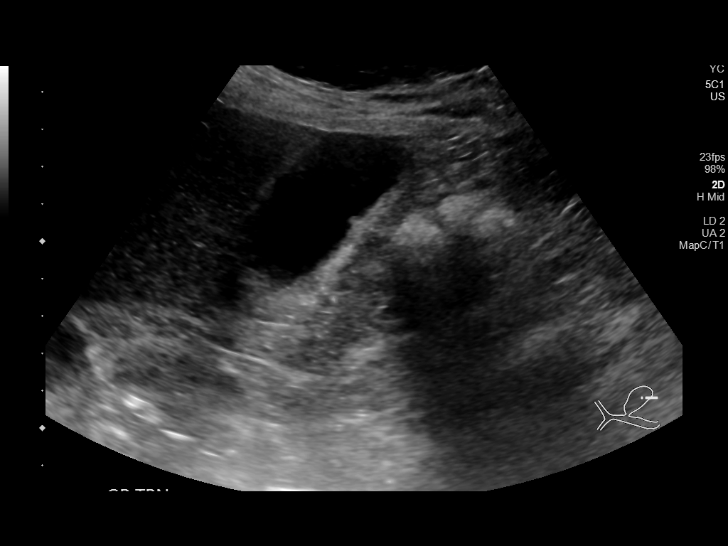
[im 28/112]
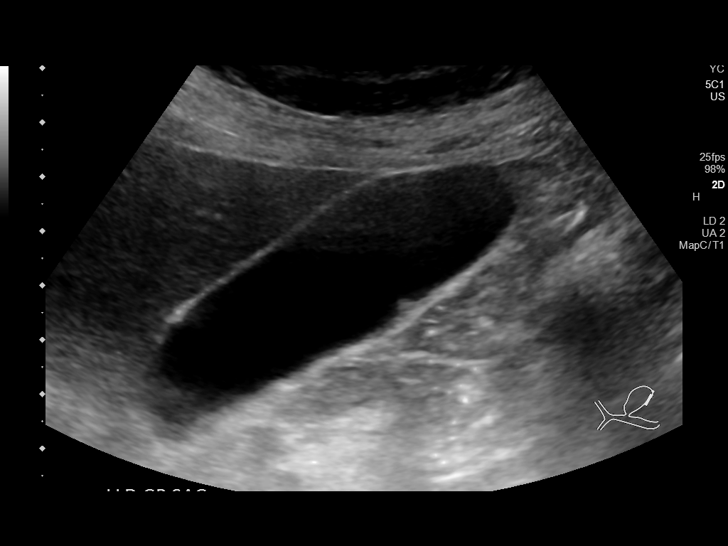
[im 38/112]
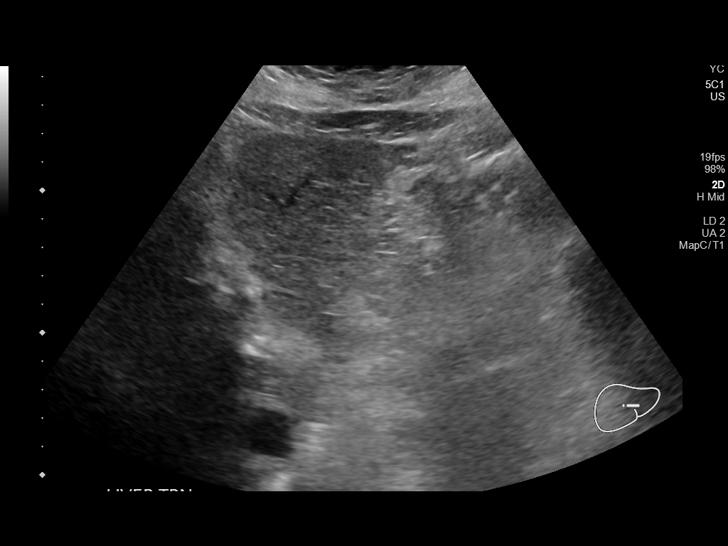
[im 47/112]
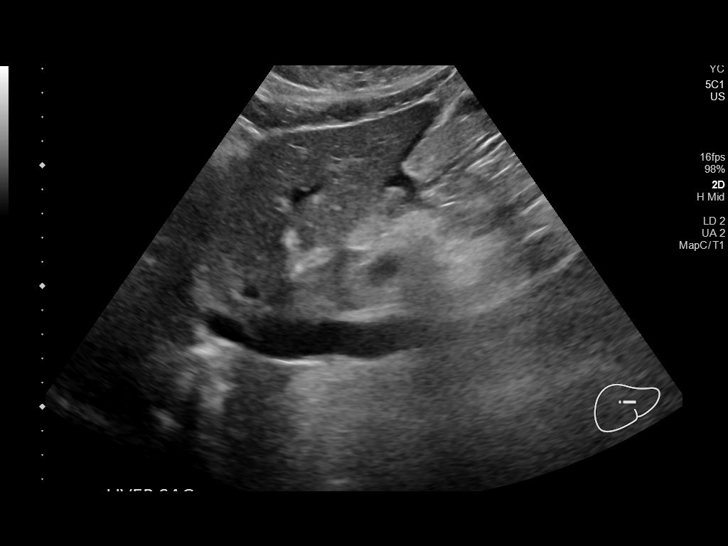
[im 56/112]
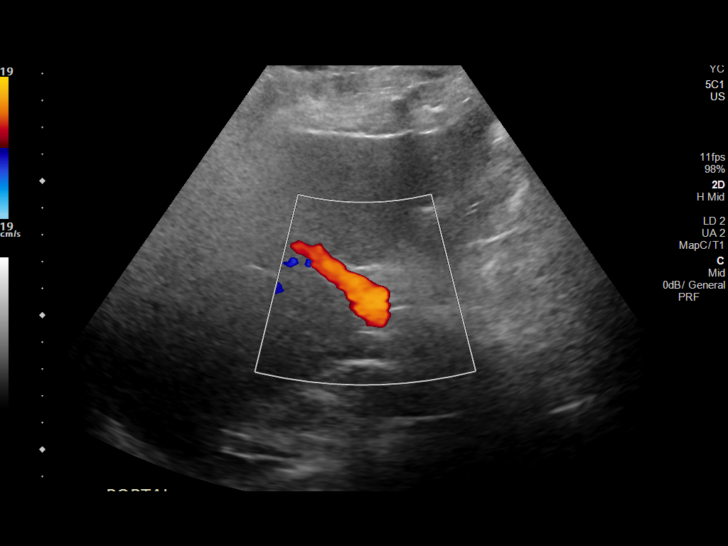
[im 65/112]
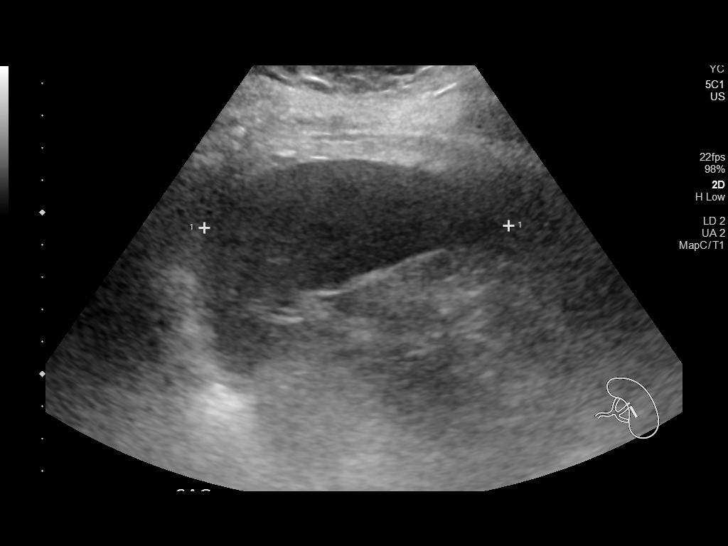
[im 75/112]
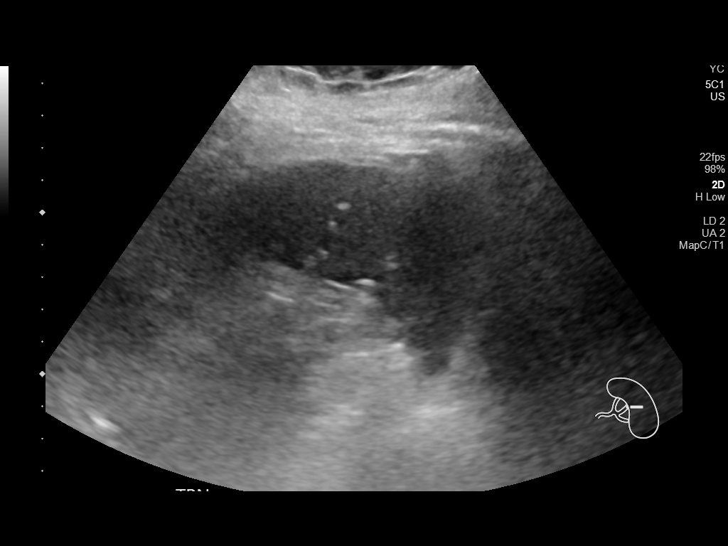
[im 84/112]
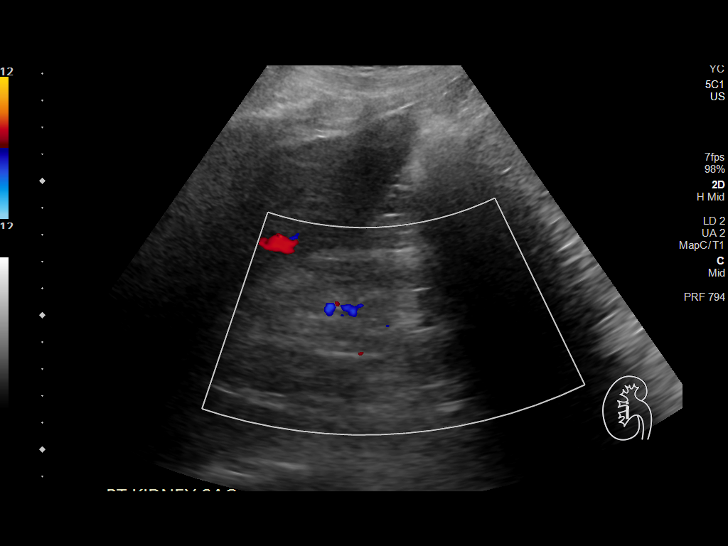
[im 93/112]
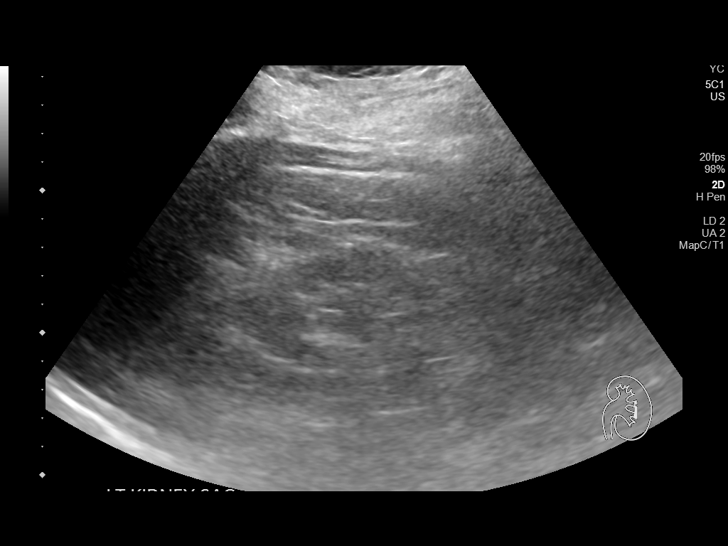
[im 102/112]
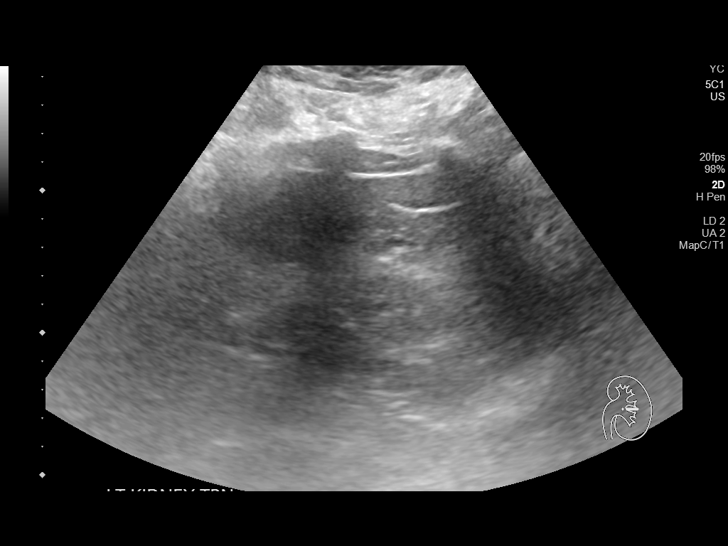
[im 112/112]
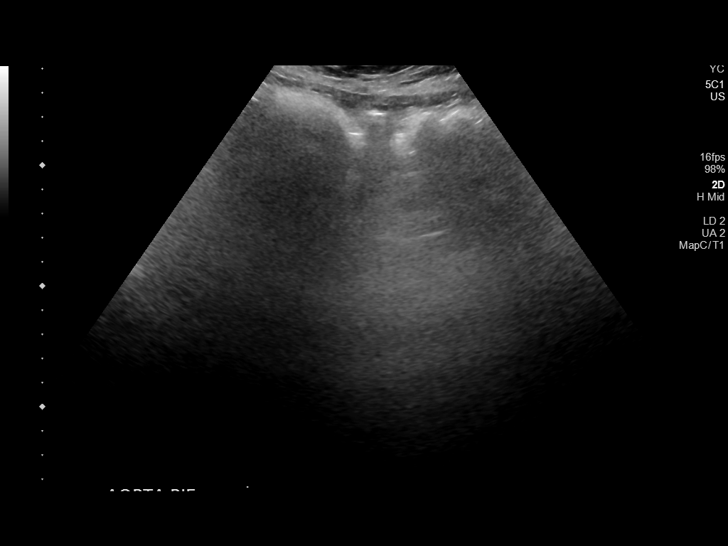

[13 of 25 positions shown; findings below may reference images not displayed]

FINDINGS: Gallbladder: Mild cholelithiasis with largest stone measuring 1 cm.
Two polyps with the larger measuring 4 mm over the fundus. No
evidence of gallbladder wall thickening or sludge. Negative
sonographic Murphy sign. No adjacent free fluid.

Common bile duct: Diameter: 3.7 mm.

Liver: Mild increased echogenicity compatible with steatosis without
focal mass. Portal vein is patent on color Doppler imaging with
normal direction of blood flow towards the liver.

IVC: No abnormality visualized.

Pancreas: Visualized portion unremarkable.

Spleen: Size and appearance within normal limits.

Right Kidney: Length: 10.2 cm. Echogenicity within normal limits. No
mass or hydronephrosis visualized. 2.6 cm cyst present.

Left Kidney: Length: 10.8 cm. Echogenicity within normal limits. No
mass or hydronephrosis visualized.

Abdominal aorta: No aneurysm visualized.

Other findings: No free fluid.
IMPRESSION: 1.  No acute findings.

2. Mild cholelithiasis without additional sonographic evidence to
suggest cholecystitis. Two small gallbladder polyps with the larger
measuring 4 mm.

3.  2.6 cm right renal cyst.

4.  Hepatic steatosis without focal mass.

## 2020-08-13 ENCOUNTER — Other Ambulatory Visit: Payer: Self-pay | Admitting: Cardiovascular Disease
